# Patient Record
Sex: Female | Born: 1955 | Race: White | Hispanic: No | Smoking: Never smoker
Health system: Southern US, Community
[De-identification: ages and names within clinical notes are randomized; demographics above are authoritative.]

## PROBLEM LIST (undated history)

## (undated) DIAGNOSIS — K222 Esophageal obstruction: Secondary | ICD-10-CM

## (undated) DIAGNOSIS — Z8719 Personal history of other diseases of the digestive system: Secondary | ICD-10-CM

## (undated) DIAGNOSIS — Z87442 Personal history of urinary calculi: Secondary | ICD-10-CM

## (undated) DIAGNOSIS — K279 Peptic ulcer, site unspecified, unspecified as acute or chronic, without hemorrhage or perforation: Secondary | ICD-10-CM

## (undated) DIAGNOSIS — J189 Pneumonia, unspecified organism: Secondary | ICD-10-CM

## (undated) DIAGNOSIS — H6120 Impacted cerumen, unspecified ear: Secondary | ICD-10-CM

## (undated) DIAGNOSIS — K922 Gastrointestinal hemorrhage, unspecified: Secondary | ICD-10-CM

## (undated) DIAGNOSIS — I639 Cerebral infarction, unspecified: Secondary | ICD-10-CM

## (undated) DIAGNOSIS — Z9289 Personal history of other medical treatment: Secondary | ICD-10-CM

## (undated) DIAGNOSIS — K5090 Crohn's disease, unspecified, without complications: Secondary | ICD-10-CM

## (undated) DIAGNOSIS — Z9889 Other specified postprocedural states: Secondary | ICD-10-CM

## (undated) DIAGNOSIS — I10 Essential (primary) hypertension: Secondary | ICD-10-CM

## (undated) DIAGNOSIS — R112 Nausea with vomiting, unspecified: Secondary | ICD-10-CM

## (undated) DIAGNOSIS — E7800 Pure hypercholesterolemia, unspecified: Secondary | ICD-10-CM

## (undated) DIAGNOSIS — K219 Gastro-esophageal reflux disease without esophagitis: Secondary | ICD-10-CM

## (undated) HISTORY — PX: ESOPHAGOGASTRODUODENOSCOPY: SHX1529

## (undated) HISTORY — PX: COLONOSCOPY: SHX174

## (undated) HISTORY — DX: Impacted cerumen, unspecified ear: H61.20

---

## 1974-10-29 HISTORY — PX: APPENDECTOMY: SHX54

## 1975-10-30 HISTORY — PX: TOTAL ABDOMINAL HYSTERECTOMY: SHX209

## 1976-10-29 HISTORY — PX: SALPINGOOPHORECTOMY: SHX82

## 1996-10-29 DIAGNOSIS — J189 Pneumonia, unspecified organism: Secondary | ICD-10-CM

## 1996-10-29 HISTORY — DX: Pneumonia, unspecified organism: J18.9

## 2004-10-29 DIAGNOSIS — Z9289 Personal history of other medical treatment: Secondary | ICD-10-CM

## 2004-10-29 HISTORY — DX: Personal history of other medical treatment: Z92.89

## 2004-10-29 HISTORY — PX: OTHER SURGICAL HISTORY: SHX169

## 2007-12-04 ENCOUNTER — Other Ambulatory Visit: Admission: RE | Admit: 2007-12-04 | Discharge: 2007-12-04 | Payer: Self-pay | Admitting: *Deleted

## 2007-12-18 ENCOUNTER — Ambulatory Visit (HOSPITAL_COMMUNITY): Admission: RE | Admit: 2007-12-18 | Discharge: 2007-12-18 | Payer: Self-pay | Admitting: *Deleted

## 2008-01-02 ENCOUNTER — Encounter: Admission: RE | Admit: 2008-01-02 | Discharge: 2008-01-02 | Payer: Self-pay | Admitting: *Deleted

## 2008-01-12 ENCOUNTER — Encounter: Admission: RE | Admit: 2008-01-12 | Discharge: 2008-01-12 | Payer: Self-pay | Admitting: *Deleted

## 2008-08-19 ENCOUNTER — Emergency Department (HOSPITAL_COMMUNITY): Admission: EM | Admit: 2008-08-19 | Discharge: 2008-08-19 | Payer: Self-pay | Admitting: Emergency Medicine

## 2011-07-31 LAB — POCT I-STAT, CHEM 8
Creatinine, Ser: 0.9
Glucose, Bld: 116 — ABNORMAL HIGH
Hemoglobin: 16 — ABNORMAL HIGH
Potassium: 3.9
Sodium: 138

## 2012-01-09 ENCOUNTER — Inpatient Hospital Stay (HOSPITAL_BASED_OUTPATIENT_CLINIC_OR_DEPARTMENT_OTHER)
Admission: EM | Admit: 2012-01-09 | Discharge: 2012-01-11 | DRG: 063 | Disposition: A | Discharge status: Home / Self Care | Payer: 59 | Source: Ambulatory Visit | Attending: Neurology | Admitting: Neurology

## 2012-01-09 ENCOUNTER — Inpatient Hospital Stay (HOSPITAL_COMMUNITY): Payer: 59

## 2012-01-09 ENCOUNTER — Encounter (HOSPITAL_BASED_OUTPATIENT_CLINIC_OR_DEPARTMENT_OTHER): Payer: Self-pay | Admitting: *Deleted

## 2012-01-09 ENCOUNTER — Emergency Department (HOSPITAL_BASED_OUTPATIENT_CLINIC_OR_DEPARTMENT_OTHER): Payer: 59

## 2012-01-09 ENCOUNTER — Emergency Department (INDEPENDENT_AMBULATORY_CARE_PROVIDER_SITE_OTHER): Payer: 59

## 2012-01-09 ENCOUNTER — Other Ambulatory Visit: Payer: Self-pay

## 2012-01-09 DIAGNOSIS — Z8673 Personal history of transient ischemic attack (TIA), and cerebral infarction without residual deficits: Secondary | ICD-10-CM

## 2012-01-09 DIAGNOSIS — I6350 Cerebral infarction due to unspecified occlusion or stenosis of unspecified cerebral artery: Principal | ICD-10-CM | POA: Diagnosis present

## 2012-01-09 DIAGNOSIS — H53461 Homonymous bilateral field defects, right side: Secondary | ICD-10-CM

## 2012-01-09 DIAGNOSIS — I10 Essential (primary) hypertension: Secondary | ICD-10-CM | POA: Diagnosis present

## 2012-01-09 DIAGNOSIS — I517 Cardiomegaly: Secondary | ICD-10-CM

## 2012-01-09 DIAGNOSIS — E785 Hyperlipidemia, unspecified: Secondary | ICD-10-CM

## 2012-01-09 DIAGNOSIS — I639 Cerebral infarction, unspecified: Secondary | ICD-10-CM

## 2012-01-09 DIAGNOSIS — K219 Gastro-esophageal reflux disease without esophagitis: Secondary | ICD-10-CM | POA: Diagnosis present

## 2012-01-09 DIAGNOSIS — H53469 Homonymous bilateral field defects, unspecified side: Secondary | ICD-10-CM

## 2012-01-09 HISTORY — DX: Gastro-esophageal reflux disease without esophagitis: K21.9

## 2012-01-09 HISTORY — DX: Essential (primary) hypertension: I10

## 2012-01-09 HISTORY — DX: Crohn's disease, unspecified, without complications: K50.90

## 2012-01-09 LAB — TROPONIN I: Troponin I: <0.3 ng/mL (ref <0.30)

## 2012-01-09 LAB — COMPREHENSIVE METABOLIC PANEL
ALT: 21 U/L (ref 0–35)
Alkaline Phosphatase: 98 U/L (ref 39–117)
CO2: 25 mEq/L (ref 19–32)
Chloride: 103 mEq/L (ref 96–112)
Creatinine, Ser: 0.8 mg/dL (ref 0.50–1.10)
GFR calc Af Amer: >90 mL/min (ref >90)
Glucose, Bld: 116 mg/dL — ABNORMAL HIGH (ref 70–99)
Potassium: 3.9 mEq/L (ref 3.5–5.1)

## 2012-01-09 LAB — CBC
HCT: 43.2 % (ref 36.0–46.0)
Hemoglobin: 14.3 g/dL (ref 12.0–15.0)
MCHC: 33.1 g/dL (ref 30.0–36.0)
Platelets: 265 10*3/uL (ref 150–400)
WBC: 9.5 10*3/uL (ref 4.0–10.5)

## 2012-01-09 LAB — DIFFERENTIAL
Basophils Absolute: 0.1 10*3/uL (ref 0.0–0.1)
Basophils Relative: 1 % (ref 0–1)
Lymphocytes Relative: 27 % (ref 12–46)
Lymphs Abs: 2.5 10*3/uL (ref 0.7–4.0)
Monocytes Relative: 6 % (ref 3–12)
Neutro Abs: 6.2 10*3/uL (ref 1.7–7.7)
Neutrophils Relative %: 66 % (ref 43–77)

## 2012-01-09 LAB — MRSA PCR SCREENING: MRSA by PCR: NEGATIVE

## 2012-01-09 MED ORDER — SENNOSIDES-DOCUSATE SODIUM 8.6-50 MG PO TABS
1.0000 | ORAL_TABLET | Freq: Every evening | ORAL | Status: DC | PRN
  Filled 2012-01-09: qty 1

## 2012-01-09 MED ORDER — SODIUM CHLORIDE 0.9 % IV SOLN
250.0000 mL | Freq: Once | INTRAVENOUS | Status: AC
  Administered 2012-01-09: 07:00:00 via INTRAVENOUS

## 2012-01-09 MED ORDER — PANTOPRAZOLE SODIUM 40 MG IV SOLR
40.0000 mg | Freq: Every day | INTRAVENOUS | Status: DC
  Administered 2012-01-09 – 2012-01-10 (×2): 40 mg via INTRAVENOUS
  Filled 2012-01-09 (×3): qty 40

## 2012-01-09 MED ORDER — ONDANSETRON HCL 4 MG/2ML IJ SOLN: 4.0000 mg | Freq: Four times a day (QID) | INTRAMUSCULAR | Status: DC | PRN

## 2012-01-09 MED ORDER — SODIUM CHLORIDE 0.9 % IV SOLN
INTRAVENOUS | Status: DC
  Administered 2012-01-09: 09:00:00 via INTRAVENOUS
  Administered 2012-01-09: 75 mL/h via INTRAVENOUS

## 2012-01-09 MED ORDER — ALTEPLASE (STROKE) FULL DOSE INFUSION
0.9000 mg/kg | Freq: Once | INTRAVENOUS | Status: AC
  Administered 2012-01-09: 100 mg via INTRAVENOUS

## 2012-01-09 MED ORDER — ACETAMINOPHEN 325 MG PO TABS: 650.0000 mg | ORAL_TABLET | ORAL | Status: DC | PRN

## 2012-01-09 MED ORDER — LABETALOL HCL 5 MG/ML IV SOLN: 10.0000 mg | INTRAVENOUS | Status: DC | PRN

## 2012-01-09 MED ORDER — ALTEPLASE 100 MG IV SOLR
INTRAVENOUS | Status: AC
  Filled 2012-01-09: qty 1

## 2012-01-09 MED ORDER — STROKE: EARLY STAGES OF RECOVERY BOOK
Freq: Once | Status: AC
  Administered 2012-01-09: 13:00:00
  Filled 2012-01-09: qty 1

## 2012-01-09 MED ORDER — LABETALOL HCL 5 MG/ML IV SOLN: 10.0000 mg | Freq: Once | INTRAVENOUS | Status: DC

## 2012-01-09 MED ORDER — LORAZEPAM 2 MG/ML IJ SOLN
2.0000 mg | Freq: Four times a day (QID) | INTRAMUSCULAR | Status: DC | PRN
  Administered 2012-01-10 (×2): 1 mg via INTRAVENOUS
  Filled 2012-01-09 (×2): qty 1

## 2012-01-09 MED ORDER — ACETAMINOPHEN 650 MG RE SUPP: 650.0000 mg | RECTAL | Status: DC | PRN

## 2012-01-09 NOTE — H&P (Signed)
Admission H&P    Chief Complaint: Visual loss, sudden onset  HPI: Shari Taylor is an 56 y.o. female history of hypertension, presenting with acute onset of visual difficulty. Onset was at 0400 today. She was seen in the emergency room at [facility]. Examination there showed right lower visual field deficit. NIH stroke score was 1. Laboratory studies were unremarkable. She was given intravenous thrombolytic therapy with TPA and subsequently transferred to [facility] for further management. Vision improved with apparent resolution of visual field defect. NIH stroke score on admission was 0. CT of her head at [facility] showed no acute changes. 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 CTA on arrival at [facility] was normal, including carotids and circle of Willis. No previous history of stroke nor TIA. Patient has not been on antiplatelet therapy.                                                                                                          LSN: [id number] today  tPA Given: Yes MRankin: 0  Past Medical History  Diagnosis Date  . Hypertension   . GERD (gastroesophageal reflux disease)   . Crohn disease     Past Surgical History  Procedure Date  . Appendectomy   . Abdominal hysterectomy   . Bowel resection     No family history on file. Social History:  reports that she has never smoked. She does not have any smokeless tobacco history on file. She reports that she drinks alcohol. She reports that she does not use illicit drugs.  Allergies:  Allergies  Allergen Reactions  . Penicillins Anaphylaxis  . Clindamycin/Lincomycin     Intestinal bleeding    Medications Prior to Admission  Medication Dose Route Frequency Provider Last Rate Last Dose  . 0.9 %  sodium chloride infusion  250 mL Intravenous Once Vida Roller, MD 250 mL/hr at 01/09/12 0645    . 0.9 %  sodium  chloride infusion   Intravenous Continuous Noel Christmas      . acetaminophen (TYLENOL) tablet 650 mg  650 mg Oral Q4H PRN Noel Christmas       Or  . acetaminophen (TYLENOL) suppository 650 mg  650 mg Rectal Q4H PRN Noel Christmas      . alteplase (ACTIVASE) 1 mg/mL infusion 0.9 mg/kg  0.9 mg/kg Intravenous Once Vida Roller, MD   100 mg at 01/09/12 0651  . alteplase (ACTIVASE) 2 MG injection           . labetalol (NORMODYNE,TRANDATE) injection 10 mg  10 mg Intravenous Once Vida Roller, MD      . labetalol (NORMODYNE,TRANDATE) injection 10 mg  10 mg Intravenous Q10 min PRN Noel Christmas      . ondansetron (ZOFRAN) injection 4 mg  4 mg Intravenous Q6H PRN Noel Christmas      . pantoprazole (PROTONIX) injection 40 mg  40 mg Intravenous QHS Noel Christmas      .  senna-docusate (Senokot-S) tablet 1 tablet  1 tablet Oral QHS PRN Noel Christmas       No current outpatient prescriptions on file as of 01/09/2012.    ROS: History obtained from the patient.  General ROS: negative for - chills, fatigue, fever, night sweats, weight gain or weight loss Psychological ROS: negative for - behavioral disorder, hallucinations, memory difficulties, mood swings or suicidal ideation Ophthalmic ROS: Intermittent blurring of vision and flashing lights on the left side over the past week ENT ROS: negative for - epistaxis, nasal discharge, oral lesions, sore throat, tinnitus or vertigo Allergy and Immunology ROS: negative for - hives or itchy/watery eyes Hematological and Lymphatic ROS: negative for - bleeding problems, bruising or swollen lymph nodes Endocrine ROS: negative for - galactorrhea, hair pattern changes, polydipsia/polyuria or temperature intolerance Respiratory ROS: negative for - cough, hemoptysis, shortness of breath or wheezing Cardiovascular ROS: negative for - chest pain, dyspnea on exertion, edema or irregular heartbeat Gastrointestinal ROS: negative for - abdominal pain, diarrhea,  hematemesis, nausea/vomiting or stool incontinence Genito-Urinary ROS: negative for - dysuria, hematuria, incontinence or urinary frequency/urgency Musculoskeletal ROS: negative for - joint swelling or muscular weakness Neurological ROS: as noted in HPI Dermatological ROS: negative for rash and skin lesion changes   Physical Examination: Blood pressure 176/93, pulse 69, temperature 98.4 F (36.9 C), temperature source Oral, resp. rate 12, SpO2 100.00%.  HEENT-  Normocephalic, no lesions, without obvious abnormality.  Normal external eye and conjunctiva.  Normal TM's bilaterally.  Normal auditory canals and external ears. Normal external nose, mucus membranes and septum.  Normal pharynx. Neck supple with no masses, nodes, nodules or enlargement. Cardiovascular - regular rate and rhythm, S1, S2 normal, no murmur, click, rub or gallop Lungs - chest clear, no wheezing, rales, normal symmetric air entry, Heart exam - S1, S2 normal, no murmur, no gallop, rate regular Abdomen - soft, non-tender; bowel sounds normal; no masses,  no organomegaly Extremities - no joint deformities, effusion, or inflammation, no edema and no skin discoloration  Neurologic Examination: Mental Status: Alert, oriented, thought content appropriate.  Patient was fairly anxious. Speech fluent without evidence of aphasia. Able to follow commands without difficulty. Cranial Nerves: II-Visual fields were normal with no indication of residual right visual field defect. III/IV/VI-Extraocular movements were full and conjugate.  Pupils reacted normally to light. V/VII-no facial numbness and no facial weakness VIII-grossly intact X-normal speech XII-midline tongue extension Motor: 5/5 bilaterally with normal tone and bulk Sensory: light touch intact throughout, bilaterally Deep Tendon Reflexes: 2+ and symmetric throughout Plantars: Downgoing bilaterally Cerebellar: Normal finger-to-nose and heel-to-shin test.  Normal gait and  station.   Carotid auscultation: Normal  Results for orders placed during the hospital encounter of 01/09/12 (from the past 48 hour(s))  PROTIME-INR     Status: Normal   Collection Time   01/09/12  5:50 AM      Component Value Range Comment   Prothrombin Time 13.4  11.6 - 15.2 (seconds)    INR 1.00  0.00 - 1.49    APTT     Status: Normal   Collection Time   01/09/12  5:50 AM      Component Value Range Comment   aPTT 31  24 - 37 (seconds)   CBC     Status: Normal   Collection Time   01/09/12  5:50 AM      Component Value Range Comment   WBC 9.5  4.0 - 10.5 (K/uL)    RBC 4.91  3.87 - 5.11 (  MIL/uL)    Hemoglobin 14.3  12.0 - 15.0 (g/dL)    HCT 45.4  09.8 - 11.9 (%)    MCV 88.0  78.0 - 100.0 (fL)    MCH 29.1  26.0 - 34.0 (pg)    MCHC 33.1  30.0 - 36.0 (g/dL)    RDW 14.7  82.9 - 56.2 (%)    Platelets 265  150 - 400 (K/uL)   DIFFERENTIAL     Status: Normal   Collection Time   01/09/12  5:50 AM      Component Value Range Comment   Neutrophils Relative 66  43 - 77 (%)    Neutro Abs 6.2  1.7 - 7.7 (K/uL)    Lymphocytes Relative 27  12 - 46 (%)    Lymphs Abs 2.5  0.7 - 4.0 (K/uL)    Monocytes Relative 6  3 - 12 (%)    Monocytes Absolute 0.6  0.1 - 1.0 (K/uL)    Eosinophils Relative 1  0 - 5 (%)    Eosinophils Absolute 0.1  0.0 - 0.7 (K/uL)    Basophils Relative 1  0 - 1 (%)    Basophils Absolute 0.1  0.0 - 0.1 (K/uL)   COMPREHENSIVE METABOLIC PANEL     Status: Abnormal   Collection Time   01/09/12  5:50 AM      Component Value Range Comment   Sodium 139  135 - 145 (mEq/L)    Potassium 3.9  3.5 - 5.1 (mEq/L)    Chloride 103  96 - 112 (mEq/L)    CO2 25  19 - 32 (mEq/L)    Glucose, Bld 116 (*) 70 - 99 (mg/dL)    BUN 20  6 - 23 (mg/dL)    Creatinine, Ser 1.30  0.50 - 1.10 (mg/dL)    Calcium 9.9  8.4 - 10.5 (mg/dL)    Total Protein 7.4  6.0 - 8.3 (g/dL)    Albumin 4.3  3.5 - 5.2 (g/dL)    AST 16  0 - 37 (U/L)    ALT 21  0 - 35 (U/L)    Alkaline Phosphatase 98  39 - 117 (U/L)     Total Bilirubin 0.5  0.3 - 1.2 (mg/dL)    GFR calc non Af Amer 81 (*) >90 (mL/min)    GFR calc Af Amer >90  >90 (mL/min)   CK TOTAL AND CKMB     Status: Normal   Collection Time   01/09/12  5:50 AM      Component Value Range Comment   Total CK 69  7 - 177 (U/L)    CK, MB 2.5  0.3 - 4.0 (ng/mL)    Relative Index RELATIVE INDEX IS INVALID  0.0 - 2.5    TROPONIN I     Status: Normal   Collection Time   01/09/12  5:50 AM      Component Value Range Comment   Troponin I <0.30  <0.30 (ng/mL)    Ct Head Wo Contrast  01/09/2012  *RADIOLOGY REPORT*  Clinical Data: Visual changes  CT HEAD WITHOUT CONTRAST  Technique:  Contiguous axial images were obtained from the base of the skull through the vertex without contrast.  Comparison: None.  Findings: Periventricular and subcortical white matter hypodensities are most in keeping with chronic microangiopathic change.  More focal hypoattenuation within the right  anterior limb internal capsule.  There is no evidence for acute hemorrhage, overt hydrocephalus, mass lesion, or abnormal extra-axial fluid collection.  No  definite CT evidence for acute cortical based (large artery) infarction.  The visualized paranasal sinuses and mastoid air cells are predominately clear.  The globes are symmetric.  IMPRESSION: Hypoattenuation within the right anterior limb internal capsule may represent an age indeterminate lacunar infarction.  There is no cortical based (large artery) infarction.  Original Report Authenticated By: Waneta Martins, M.D.    Assessment: 56 y.o. female transient right visual field defect, resolved following thrombolytic therapy with IV TPA. Acute left PCA territory stroke with need to be ruled out. Overall stroke risk will also need to be addressed.  Stroke Risk Factors - hypertension  Plan: 1. HgbA1c, fasting lipid panel 2. MRI of the brain without contrast 3. PT consult, OT consult, Speech consult 4. Echocardiogram 5. Prophylactic  therapy-Antiplatelet med: Aspirin 325 mg per day if CT scan of the head 24 hours post t-PA shows no hemorrhage 6. Risk factor modification 7. Telemetry monitoring  C.R. Roseanne Reno, MD [facility] [phone]  01/09/2012, 8:40 AM

## 2012-01-09 NOTE — Progress Notes (Addendum)
01/09/2012 08:20   Pt arrived to ICU via [facility]. tpa administration & CTA head/neck complete. Dr. Roseanne Reno at bedside.   Holly Bodily

## 2012-01-09 NOTE — Evaluation (Signed)
Speech Language Pathology Evaluation Patient Details Name: MAKARI SANKO MRN: [id number] DOB: 1956-10-18 Today's Date: 01/09/2012  HPI:  56 yr old admitted with sudden visual disturbance.  CT revealed hypoattenuation in right internal capsule with possible age inderterminate infarct.    Problem List: There is no problem list on file for this patient.  Past Medical History:  Past Medical History  Diagnosis Date  . Hypertension   . GERD (gastroesophageal reflux disease)   . Crohn disease    Past Surgical History:  Past Surgical History  Procedure Date  . Appendectomy   . Abdominal hysterectomy   . Bowel resection     SLP Assessment/Plan/Recommendation Assessment Clinical Impression Statement: Pt. performed within fucntional limits for all cognitive items assessed.  Speech is fluent and intelligible.  She did not exhibit difficulty expressing thoughts or relaying events from today and past several weeks.  ST is not warrented at this time.  Discussed with pt. that ST is available if pt. experiences difficutly once bact to normal routine. 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[employer])  Cognition Overall Cognitive Status: Appears within functional limits for tasks assessed Orientation Level: Oriented X4  Comprehension Auditory Comprehension Overall Auditory Comprehension: Appears within functional limits for tasks assessed Interfering Components:   (SLIGHTLY ANXIOUS) Visual Recognition/Discrimination Discrimination: Not tested Reading Comprehension Reading Status: Within funtional limits  Expression Expression Primary Mode of Expression: Verbal Verbal Expression Overall Verbal Expression: Appears within functional limits for tasks assessed Pragmatics: No impairment Non-Verbal Means of Communication: Not applicable Written Expression Written Expression: Not tested  Oral/Motor Oral Motor/Sensory Function Overall Oral Motor/Sensory Function: Appears within functional limits for tasks assessed Motor Speech Overall Motor Speech: Appears within functional limits for tasks assessed Intelligibility: Intelligible Motor Planning: Witnin functional limits  Breck Coons [employer] [phone]  01/09/2012

## 2012-01-09 NOTE — ED Notes (Signed)
Pt says that she has had a hx of floater in left eye, within the hour has had flashers and having blind spots.

## 2012-01-09 NOTE — ED Notes (Signed)
MD at bedside to discuss plan of care

## 2012-01-09 NOTE — ED Provider Notes (Signed)
History     CSN: [id number]  Arrival date & time 01/09/12  4696   First MD Initiated Contact with Patient 01/09/12 [phone]      Chief Complaint  Patient presents with  . Code Stroke  . Visual Field Change    (Consider location/radiation/quality/duration/timing/severity/associated sxs/prior treatment) HPI Comments: 47 rolled female with history of hypertension for which she takes hypertensives presents with acute onset of floaters, flashing is loss of vision. This started approximately 4:00 AM, is persistent, nothing makes this better or worse, this is not associated with weakness, numbness, double vision, ataxia, a fascia. She denies any chest pain, palpitations, neck pain. She states that over the last couple of weeks she has had a floater which she sees in her left eye but today this for became more prominent, and was associated with loss of vision. She notices a loss of vision predominantly in her left eye.  The history is provided by the patient.    Past Medical History  Diagnosis Date  . Hypertension   . GERD (gastroesophageal reflux disease)   . Crohn disease     Past Surgical History  Procedure Date  . Appendectomy   . Abdominal hysterectomy   . Bowel resection     No family history on file.  History  Substance Use Topics  . Smoking status: Never Smoker   . Smokeless tobacco: Not on file  . Alcohol Use: Yes    OB History    Grav Para Term Preterm Abortions TAB SAB Ect Mult Living                  Review of Systems  All other systems reviewed and are negative.    Allergies  Penicillins and Clindamycin/lincomycin  Home Medications   Current Outpatient Rx  Name Route Sig Dispense Refill  . AMLODIPINE BESYLATE 10 MG PO TABS Oral Take 10 mg by mouth daily.    Marland Kitchen HYDROCHLOROTHIAZIDE 25 MG PO TABS Oral Take 25 mg by mouth daily.    Marland Kitchen RANITIDINE HCL 150 MG PO CAPS Oral Take 150 mg by mouth 2 (two) times daily.      BP 190/101  Pulse 77  Temp(Src) 97.7  F (36.5 C) (Oral)  Resp 15  SpO2 100%  Physical Exam  Nursing note and vitals reviewed. Constitutional: She appears well-developed and well-nourished. No distress.  HENT:  Head: Normocephalic and atraumatic.  Mouth/Throat: Oropharynx is clear and moist. No oropharyngeal exudate.  Eyes: Conjunctivae and EOM are normal. Pupils are equal, round, and reactive to light. Right eye exhibits no discharge. Left eye exhibits no discharge. No scleral icterus.       No afferent pupillary defect, normal extraocular movements, no ptosis or lid lag, pupils 5 mm and reactive bilaterally.  Optic discs are crisp bilaterally, intraocular vessels with no obvious abnormalities  Neck: Normal range of motion. Neck supple. No JVD present. No thyromegaly present.       No carotid bruit  Cardiovascular: Normal rate, regular rhythm, normal heart sounds and intact distal pulses.  Exam reveals no gallop and no friction rub.   No murmur heard. Pulmonary/Chest: Effort normal and breath sounds normal. No respiratory distress. She has no wheezes. She has no rales.  Abdominal: Soft. Bowel sounds are normal. She exhibits no distension and no mass. There is no tenderness.  Musculoskeletal: Normal range of motion. She exhibits no edema and no tenderness.  Lymphadenopathy:    She has no cervical adenopathy.  Neurological: She is alert.  Coordination normal.       Neurologic exam:  Speech clear, pupils equal round reactive to light, extraocular movements intact  Normal peripheral visual fields Cranial nerves III through XII normal including no facial droop Follows commands, moves all extremities x4, normal strength to bilateral upper and lower extremities at all major muscle groups including grip Sensation normal to light touch and pinprick Coordination intact, no limb ataxia, finger-nose-finger normal Rapid alternating movements normal No pronator drift Gait normal  Vision at baseline except for homonymous hemianopsia  with loss of vision in the lower right visual field in bilateral eyes   Skin: Skin is warm and dry. No rash noted. No erythema.  Psychiatric: She has a normal mood and affect. Her behavior is normal.    ED Course  Procedures (including critical care time)  ED ECG REPORT   Date: 01/09/2012   Rate: 70  Rhythm: normal sinus rhythm  QRS Axis: normal  Intervals: normal  ST/T Wave abnormalities: normal  Conduction Disutrbances:none  Narrative Interpretation: Normal ECG  Old EKG Reviewed: Compared with EKG from 08/20/1999, no acute changes       Labs Reviewed  PROTIME-INR  APTT  CBC  DIFFERENTIAL  CK TOTAL AND CKMB  TROPONIN I  COMPREHENSIVE METABOLIC PANEL   Ct Head Wo Contrast  01/09/2012  *RADIOLOGY REPORT*  Clinical Data: Visual changes  CT HEAD WITHOUT CONTRAST  Technique:  Contiguous axial images were obtained from the base of the skull through the vertex without contrast.  Comparison: None.  Findings: Periventricular and subcortical white matter hypodensities are most in keeping with chronic microangiopathic change.  More focal hypoattenuation within the right  anterior limb internal capsule.  There is no evidence for acute hemorrhage, overt hydrocephalus, mass lesion, or abnormal extra-axial fluid collection.  No definite CT evidence for acute cortical based (large artery) infarction.  The visualized paranasal sinuses and mastoid air cells are predominately clear.  The globes are symmetric.  IMPRESSION: Hypoattenuation within the right anterior limb internal capsule may represent an age indeterminate lacunar infarction.  There is no cortical based (large artery) infarction.  Original Report Authenticated By: Waneta Martins, M.D.     1. Stroke   2. Hemianopia, homonymous, right       MDM  The patient appears to have a new homonymous hemianopsia. With her hypertension and blood pressure of 200 systolic would be concerned for vascular cause. Neurology paged immediately  consultation. CT scan ordered, lumbar, EKG. There are no other findings on neurologic exam other than visual loss.  Code stroke activated at 5:48 AM   Lab work shows normal CBC, no anemia, normal platelets, normal coagulation panel. CT scan of the head shows hypoattenuation within the right anterior limb of the internal capsule. There is no other large area of infarction.  Neurology returned call approximately [phone], accepted patient in transfer to intensive care unit, recommended thrombolytic therapy given visual loss. Blood pressure has improved to 175/100, this is amenable to lytic therapy. There are no other contraindications at this time to thrombolytics. I have discussed at length with the patient the risks including the risk of bleeding, benefits and alternatives and she has agreed to proceed with thrombolytics.  Will transfer to [facility]  CRITICAL CARE Performed by: Vida Roller   Total critical care time: 35  Critical care time was exclusive of separately billable procedures and treating other patients.  Critical care was necessary to treat or prevent imminent or life-threatening deterioration.  Critical care was  time spent personally by me on the following activities: development of treatment plan with patient and/or surrogate as well as nursing, discussions with consultants, evaluation of patient's response to treatment, examination of patient, obtaining history from patient or surrogate, ordering and performing treatments and interventions, ordering and review of laboratory studies, ordering and review of radiographic studies, pulse oximetry and re-evaluation of patient's condition.       Vida Roller, MD 01/09/12 [phone]

## 2012-01-09 NOTE — Progress Notes (Signed)
  Echocardiogram 2D Echocardiogram has been performed.  Jorje Guild [facility] 01/09/2012, 10:47 AM

## 2012-01-09 NOTE — Progress Notes (Signed)
Chaplain Note:  Chaplain introduced Shari Taylor to pt and was invited into the room.  Pt was awake and resting in bed.  Chaplain noted that pt had been crying.  Chaplain offered spiritual comfort and support.  Pt was appreciative of chaplain support but stated that she did not wish to talk at this time. Chaplain will follow up if requested.  01/09/12 1600  Clinical Encounter Type  Visited With Patient  Visit Type Spiritual support  Spiritual Encounters  Spiritual Needs Emotional  Stress Factors  Patient Stress Factors Health changes  Family Stress Factors None identified (pt requested to be classified xxx)   Verdie Shire, [phone]

## 2012-01-09 NOTE — ED Notes (Signed)
Dr/ Hyacinth Meeker in with patient prior to admin of TPA to discuss risk/benefits of administration. Pt monitored 1:1 after start of TPA, currently awaiting transport by [facility] to [facility] for admission.

## 2012-01-09 NOTE — ED Notes (Signed)
Pt return from CT, NAD noted. EKG obtained. Pt alert and oriented, no slurred speech, no facial droop.

## 2012-01-09 NOTE — ED Notes (Signed)
Gave report to [place], Charity fundraiser. Confirmed that pt will still need stroke swallow screen, unable to perform d/t acuity and awaiting on transfer. Also ntoed that pt is only to receive the 54 mg dose left in pump

## 2012-01-09 NOTE — ED Notes (Signed)
TPA verified by second RN, Jodell Cipro, RN

## 2012-01-09 NOTE — ED Notes (Signed)
Code Stroke initiated by Dr. Hyacinth Meeker via Mel[place] Hake

## 2012-01-10 ENCOUNTER — Encounter (HOSPITAL_COMMUNITY): Payer: Self-pay | Admitting: Radiology

## 2012-01-10 ENCOUNTER — Inpatient Hospital Stay (HOSPITAL_COMMUNITY): Payer: 59

## 2012-01-10 LAB — HEMOGLOBIN A1C
Hgb A1c MFr Bld: 6.2 % — ABNORMAL HIGH (ref <5.7)
Mean Plasma Glucose: 131 mg/dL — ABNORMAL HIGH (ref <117)

## 2012-01-10 LAB — LIPID PANEL
HDL: 34 mg/dL — ABNORMAL LOW (ref >39)
LDL Cholesterol: 122 mg/dL — ABNORMAL HIGH (ref 0–99)

## 2012-01-10 MED ORDER — IOHEXOL 350 MG/ML SOLN
50.0000 mL | Freq: Once | INTRAVENOUS | Status: AC | PRN
  Administered 2012-01-10: 50 mL via INTRAVENOUS

## 2012-01-10 MED ORDER — MIDAZOLAM HCL 10 MG/2ML IJ SOLN: 10.0000 mg | Freq: Once | INTRAMUSCULAR | Status: DC

## 2012-01-10 MED ORDER — CLOPIDOGREL BISULFATE 75 MG PO TABS
75.0000 mg | ORAL_TABLET | Freq: Every day | ORAL | Status: DC
  Administered 2012-01-10 – 2012-01-11 (×2): 75 mg via ORAL
  Filled 2012-01-10 (×3): qty 1

## 2012-01-10 MED ORDER — FENTANYL CITRATE 0.05 MG/ML IJ SOLN: 250.0000 ug | Freq: Once | INTRAMUSCULAR | Status: DC

## 2012-01-10 MED ORDER — FAMOTIDINE 20 MG PO TABS
20.0000 mg | ORAL_TABLET | Freq: Every day | ORAL | Status: DC
  Administered 2012-01-10 – 2012-01-11 (×2): 20 mg via ORAL
  Filled 2012-01-10 (×2): qty 1

## 2012-01-10 MED ORDER — AMLODIPINE BESYLATE 10 MG PO TABS
10.0000 mg | ORAL_TABLET | Freq: Every day | ORAL | Status: DC
  Administered 2012-01-10 – 2012-01-11 (×2): 10 mg via ORAL
  Filled 2012-01-10 (×2): qty 1

## 2012-01-10 MED ORDER — HYDROCHLOROTHIAZIDE 25 MG PO TABS
25.0000 mg | ORAL_TABLET | Freq: Every day | ORAL | Status: DC
  Administered 2012-01-10 – 2012-01-11 (×2): 25 mg via ORAL
  Filled 2012-01-10 (×2): qty 1

## 2012-01-10 MED ORDER — BENZOCAINE 20 % MT SOLN
1.0000 "application " | OROMUCOSAL | Status: DC | PRN
  Filled 2012-01-10: qty 57

## 2012-01-10 MED ORDER — SODIUM CHLORIDE 0.45 % IV SOLN
INTRAVENOUS | Status: DC
  Administered 2012-01-11: 08:00:00 via INTRAVENOUS

## 2012-01-10 MED ORDER — SIMVASTATIN 20 MG PO TABS
20.0000 mg | ORAL_TABLET | Freq: Every day | ORAL | Status: DC
  Administered 2012-01-10: 20 mg via ORAL
  Filled 2012-01-10 (×2): qty 1

## 2012-01-10 MED FILL — Labetalol HCl IV Soln 5 MG/ML: INTRAVENOUS | Qty: 4 | Status: AC

## 2012-01-10 MED FILL — Morphine Sulfate Inj 10 MG/ML: INTRAMUSCULAR | Qty: 1 | Status: AC

## 2012-01-10 NOTE — Progress Notes (Signed)
Stroke Team Progress Note  HISTORY Shari Taylor is an 56 y.o. female history of hypertension, presenting with acute onset of visual difficulty that started 01/09/2012 at 0400. She was seen in the emergency room at [facility]. Examination there showed right lower visual field deficit. NIH stroke score was 1. Laboratory studies were unremarkable. She was given intravenous thrombolytic therapy with TPA and subsequently transferred to [facility] for further management. Vision improved with apparent resolution of visual field defect. NIH stroke score on admission was 0. CT of her head at [facility] showed no acute changes. 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 CTA on arrival at [facility] was normal, including carotids and circle of Willis. No previous history of stroke nor TIA. Patient has not been on antiplatelet therapy.   SUBJECTIVE Did well overnight. No complaints. BP easily controlled. No headache or migraines.F/H/o strokes and MIs but not sure of the ages of onset. Denies smoking, birth control pills, positive h/o HT and Lipids.  OBJECTIVE Filed Vitals:   01/10/12 0300 01/10/12 0400 01/10/12 0500 01/10/12 0600  BP: 150/90 150/98 150/85 150/84  Pulse: 61 69 58 61  Temp:  97.2 F (36.2 C)    TempSrc:  Oral    Resp: 15 18 11 13   Height:      Weight:      SpO2: 95% 95% 96% 98%    CBG (last 3) No results found for this basename: GLUCAP:3 in the last 72 hours Intake/Output from previous day: 03/13 0701 - 03/14 0700 In: 1207.5 [P.O.:420; I.V.:787.5] Out: 1770 [Urine:1770]  IV Fluid Intake:     . sodium chloride 75 mL/hr at 01/10/12 0600   Medications    .  stroke: mapping our early stages of recovery book   Does not apply Once  . alteplase  0.9 mg/kg Intravenous Once  . alteplase      . labetalol  10 mg Intravenous Once  . pantoprazole (PROTONIX) IV  40 mg Intravenous  QHS  PRN acetaminophen, acetaminophen, labetalol, LORazepam, ondansetron (ZOFRAN) IV, senna-docusate  Diet:  Cardiac thin liquids Activity:  Bedrest  DVT Prophylaxis:  SCDs   Significant Diagnostic Studies: CBC    Component Value Date/Time   WBC 9.5 01/09/2012 0550   RBC 4.91 01/09/2012 0550   HGB 14.3 01/09/2012 0550   HCT 43.2 01/09/2012 0550   PLT 265 01/09/2012 0550   MCV 88.0 01/09/2012 0550   MCH 29.1 01/09/2012 0550   MCHC 33.1 01/09/2012 0550   RDW 13.3 01/09/2012 0550   LYMPHSABS 2.5 01/09/2012 0550   MONOABS 0.6 01/09/2012 0550   EOSABS 0.1 01/09/2012 0550   BASOSABS 0.1 01/09/2012 0550   CMP    Component Value Date/Time   NA 139 01/09/2012 0550   K 3.9 01/09/2012 0550   CL 103 01/09/2012 0550   CO2 25 01/09/2012 0550   GLUCOSE 116* 01/09/2012 0550   BUN 20 01/09/2012 0550   CREATININE 0.80 01/09/2012 0550   CALCIUM 9.9 01/09/2012 0550   PROT 7.4 01/09/2012 0550   ALBUMIN 4.3 01/09/2012 0550   AST 16 01/09/2012 0550   ALT 21 01/09/2012 0550   ALKPHOS 98 01/09/2012 0550   BILITOT 0.5 01/09/2012 0550   GFRNONAA 81* 01/09/2012 0550   GFRAA >90 01/09/2012 0550   COAGS Lab Results  Component Value Date   INR 1.00 01/09/2012   Lipid Panel    Component Value Date/Time  CHOL 189 01/10/2012 0430   TRIG 164* 01/10/2012 0430   HDL 34* 01/10/2012 0430   CHOLHDL 5.6 01/10/2012 0430   VLDL 33 01/10/2012 0430   LDLCALC 122* 01/10/2012 0430   HgbA1C  No results found for this basename: HGBA1C   Urine Drug Screen  No results found for this basename: labopia, cocainscrnur, labbenz, amphetmu, thcu, labbarb    Alcohol Level No results found for this basename: eth   MRSA neg   CT of the brain  01/09/2012 Hypoattenuation within the right anterior limb internal capsule may represent an age indeterminate lacunar infarction.  There is no cortical based (large artery) infarction.   CT angio head 01/09/2012  1.  Focal areas of hypoattenuation within the right basal ganglia are compatible with age  indeterminate lacunar infarcts. 2.  Normal variant circle of Willis without evidence for significant proximal stenosis, aneurysm, or branch vessel occlusion.   CT angio neck  01/09/2012 1.  No significant atherosclerotic or stenotic disease in the neck. 2.  Minimal irregularity of the right carotid bifurcation without significant stenosis. 3.  Borderline adenopathy, left worse than right.  No primary lesion is evident.  This may be reactive.    MRI of the brain  ordered  MRA of the brain  See CT angio  2D Echocardiogram  EF 60-65% with no source of embolus.   Carotid Doppler  See angio  CXR  01/09/2012 1.  No acute cardiopulmonary abnormalities.   EKG  normal EKG, normal sinus rhythm.   Physical Exam   Pleasant middle aged Caucasian lady not in distress. Afebrile. Head is nontraumatic. Neck is supple without bruit. Hearing is normal. Cardiac exam no murmur or gallop. Lungs clear to auscultation. Abdomen soft nontender.  Neurological exam Awake  Alert oriented x 3. Normal speech and language.eye movements full without nystagmus. Face symmetric. Tongue midline. No visual field defect on bedside confrontational exam Normal strength, tone, reflexes and coordination. Normal sensation. Gait deferred.  NIH stroke scale is 0 ASSESSMENT [name] is a 56 y.o. female with a possible left PCA infarct with transient right visual deficit, secondary to unknown etiology, status post IV t-PA 01/09/2012 at 0651. -CT head shows a possible silent right basal ganglia infarcts of remote age -hypertension -GERD -Hyperlipidemia  Hospital day # 1  TREATMENT/PLAN -post tpa imaging (MRI ordered). Strict control of blood pressure - Start Plavix if imaging negative for hemorrhage as patient had ulcers on aspirin. Add a statin for elevated lipids. -transfer to the floor -OOB, therapy evals -Check transesophageal echocardiogram, hemoglobin A1c, hypercoagulable panel labs, vasculitis labs.This patient  is critically ill and at significant risk of neurological worsening, death and care requires constant monitoring of vital signs, hemodynamics,respiratory and cardiac monitoring, neurological assessment, discussion with family, other specialists and medical decision making of high complexity. I spent 30 minutes of neurocritical care time  in the care of  this patient.

## 2012-01-10 NOTE — Evaluation (Signed)
Physical Therapy Evaluation Patient Details Name: Shari Taylor MRN: [id number] DOB: 1956/05/10 Today's Date: 01/10/2012  Problem List: There is no problem list on file for this patient.   Past Medical History:  Past Medical History  Diagnosis Date  . Hypertension   . GERD (gastroesophageal reflux disease)   . Crohn disease    Past Surgical History:  Past Surgical History  Procedure Date  . Appendectomy   . Abdominal hysterectomy   . Bowel resection     PT Assessment/Plan/Recommendation Clinical Impression Statement: 56 yo female presently with Rt lower quadrant visual deficits and CT scan showing old Rt lucunar infarct. NIH scale on arrival =1. Pt received tPA . Pt currently with no follow-up PT needs. PT Recommendation/Assessment: Patent does not need any further PT services No Skilled PT: Patient at baseline level of functioning;Patient is independent with all acitivity/mobility Follow Up Recommendations: No PT follow up Equipment Recommended: None recommended by PT  PT Evaluation Precautions/Restrictions  Precautions Precaution Comments: none Prior Functioning  Home Living Lives With: Significant other Type of Home: House Home Layout: Two level;Able to live on main level with bedroom/bathroom Home Access: Stairs to enter Entrance Stairs-Rails: None Entrance Stairs-Number of Steps: 1-2 Bathroom Shower/Tub: Door;Walk-in Stage manager: Standard Home Adaptive Equipment: None Prior Function Level of Independence: Independent with basic ADLs;Independent with homemaking with ambulation;Independent with gait Driving: Yes Vocation: Full time 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[employer]; sitting; standing; Freight forwarder Arousal/Alertness: Awake/alert Overall Cognitive Status: Appears within functional limits for tasks assessed Orientation Level: Oriented X4 Sensation/Coordination Sensation Light Touch: Appears Intact (bil  legs) Coordination Gross Motor Movements are Fluid and Coordinated: Yes  Extremity Assessment RUE Assessment RUE Assessment: Within Functional Limits LUE Assessment LUE Assessment: Within Functional Limits RLE Assessment RLE Assessment: Within Functional Limits LLE Assessment LLE Assessment: Within Functional Limits Mobility (including Balance) Bed Mobility Bed Mobility: No Transfers Sit to Stand: With upper extremity assist;From chair/3-in-1;From toilet;With armrests;6: Modified independent (Device/Increase time);Other (comment) (grab bar beside toilet) Stand to Sit: 6: Modified independent (Device/Increase time);With upper extremity assist;With armrests;To chair/3-in-1;To toilet;Other (comment) (grab bar at toilet) Ambulation/Gait Ambulation/Gait: Yes Ambulation/Gait Assistance: 7: Independent Ambulation Distance (Feet): 220 Feet Assistive device: None Gait Pattern: Within Functional Limits Gait velocity: good ability to vary speed both faster and slower Stairs: No (pt in ICU)  Posture/Postural Control Posture/Postural Control: No significant limitations Balance Balance Assessed: Yes Dynamic Sitting Balance Dynamic Sitting - Balance Support: No upper extremity supported;Feet supported;During functional activity Dynamic Sitting - Level of Assistance: 7: Independent Static Standing Balance Static Standing - Level of Assistance: 7: Independent Rhomberg - Eyes Opened: 30  Rhomberg - Eyes Closed: 30  Dynamic Gait Index Level Surface: Normal Change in Gait Speed: Normal Gait with Horizontal Head Turns: Normal Gait with Vertical Head Turns: Normal Gait and Pivot Turn: Normal Step Over Obstacle: Normal Exercise    End of Session PT - End of Session Equipment Utilized During Treatment: Gait belt Activity Tolerance: Patient tolerated treatment well Patient left: in chair;with call bell in reach;Other (comment) (OT present) General Behavior During Session: [facility] for tasks  performed Cognition: [facility] for tasks performed  Yosmar Ryker 01/10/2012, 11:47 AM  Pager [phone]

## 2012-01-10 NOTE — Evaluation (Signed)
Occupational Therapy Evaluation Patient Details Name: Shari Taylor MRN: [id number] DOB: December 25, 1955 Today's Date: 01/10/2012  Problem List: There is no problem list on file for this patient.   Past Medical History:  Past Medical History  Diagnosis Date  . Hypertension   . GERD (gastroesophageal reflux disease)   . Crohn disease    Past Surgical History:  Past Surgical History  Procedure Date  . Appendectomy   . Abdominal hysterectomy   . Bowel resection     OT Assessment/Plan/Recommendation OT Assessment Clinical Impression Statement: 56 yo female presently with Rt lower quadrant visual deficits and CT scan showing old Rt lucunar infarct. NIH scale on arrival =1. Pt received tPa . Pt currently with no acute OT needs. Recommend follow up with ophthalmologist for visual deficits. Pt currently compensating well. OT Recommendation/Assessment: Patient does not need any further OT services OT Recommendation Follow Up Recommendations: No OT follow up Equipment Recommended: None recommended by OT  OT Evaluation   Prior Functioning Home Living Lives With: Significant other Type of Home: House Home Layout: Two level;Able to live on main level with bedroom/bathroom Home Access: Stairs to enter Entrance Stairs-Rails: None Entrance Stairs-Number of Steps: 1-2 Bathroom Shower/Tub: Door;Walk-in Stage manager: Standard Home Adaptive Equipment: None Prior Function Level of Independence: Independent with basic ADLs;Independent with homemaking with ambulation;Independent with gait Driving: Yes Vocation: Full time 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[employer]; sitting; standing; sorting ADL ADL Eating/Feeding: Simulated;Modified independent Where Assessed - Eating/Feeding: Chair Grooming: Performed;Wash/dry hands;Teeth care;Independent Where Assessed - Grooming: Standing at sink Upper Body Bathing: Simulated;Chest;Right arm;Left arm;Abdomen;Independent Where  Assessed - Upper Body Bathing: Sit to stand from chair Lower Body Bathing: Performed;Independent Where Assessed - Lower Body Bathing: Unsupported;Sitting, chair Upper Body Dressing: Simulated;Independent Where Assessed - Upper Body Dressing: Sit to stand from chair Lower Body Dressing: Performed;Independent Where Assessed - Lower Body Dressing: Sitting, chair;Unsupported Toilet Transfer: Performed;Independent Toilet Transfer Method: Proofreader: Regular height toilet Toileting - Clothing Manipulation: Performed;Independent Where Assessed - Toileting Clothing Manipulation: Sit to stand from 3-in-1 or toilet Toileting - Hygiene: Performed;Independent Where Assessed - Toileting Hygiene: Sit to stand from 3-in-1 or toilet Tub/Shower Transfer: Simulated;Modified independent Tub/Shower Transfer Method: Science writer: Walk in shower Ambulation Related to ADLs: pt ambulating and reports "it feels normal" ADL Comments: Pt reports visual changes in lower Rt quadrant. Pt completed two handouts (cancellation worksheet without any errors and drawing 3 images). Pt with deficits saying Rt Lower quadrant and states "its better but its blurry" Pt instructed to keep worksheet on table and do not move worksheet. Pt compensating by shifting weight in chair Rt and Lt to place images within visual field.   Vision/Perception  Vision - History Baseline Vision: No visual deficits Patient Visual Report: Blurring of vision Vision - Assessment Eye Alignment: Within Functional Limits Vision Assessment: Vision tested Ocular Range of Motion: Within Functional Limits Tracking/Visual Pursuits: Able to track stimulus in all quads without difficulty Convergence: Within functional limits Visual Fields: Right visual field deficit (lower quadrant) Additional Comments: see notes above of description of visual testing. Copies of test are in shade chart in progress note  section Cognition Cognition Arousal/Alertness: Awake/alert Overall Cognitive Status: Appears within functional limits for tasks assessed Orientation Level: Oriented to person;Oriented to place;Oriented to time;Disoriented X4 Sensation/Coordination Sensation Light Touch: Appears Intact (numbness in Rt hand 2nd 3rd 4th digits ) Coordination Gross Motor Movements are Fluid and Coordinated: Yes Fine Motor Movements are Fluid and Coordinated: Yes  Finger Nose Finger Test: able to complete bil UE without deficits Extremity Assessment RUE Assessment RUE Assessment: Within Functional Limits LUE Assessment LUE Assessment: Within Functional Limits Mobility  Bed Mobility Bed Mobility: No Transfers Transfers: Yes Sit to Stand: 7: Independent;With upper extremity assist;With armrests;From chair/3-in-1 Stand to Sit: 7: Independent;With upper extremity assist;To chair/3-in-1;With armrests Exercises   End of Session OT - End of Session Equipment Utilized During Treatment: Gait belt Activity Tolerance: Patient tolerated treatment well Patient left: in chair;with call bell in reach Nurse Communication: Mobility status for transfers;Mobility status for ambulation General Behavior During Session: [facility] for tasks performed Cognition: [facility] for tasks performed   Harrel Carina [facility] 01/10/2012, 10:15 AM  Pager: [phone]

## 2012-01-10 NOTE — Progress Notes (Signed)
OT Discharge Note  Patient is being discharged from OT services secondary to:    Goals met and no further therapy needs identified.  Please see latest Therapy Progress Note for current level of functioning and progress toward goals.  Progress and discharge plan and discussed with patient/caregiver and they    Agree  OT evalutation completed and recommend follow up with ophthalmologist for lower Rt quadrant visual deficits.  Shari Taylor [place]   OTR/L Pager: [phone] Office: [phone] .

## 2012-01-11 ENCOUNTER — Encounter (HOSPITAL_COMMUNITY)
Admission: EM | Disposition: A | Discharge status: Home / Self Care | Payer: Self-pay | Source: Ambulatory Visit | Attending: Neurology

## 2012-01-11 DIAGNOSIS — K219 Gastro-esophageal reflux disease without esophagitis: Secondary | ICD-10-CM | POA: Diagnosis present

## 2012-01-11 DIAGNOSIS — I10 Essential (primary) hypertension: Secondary | ICD-10-CM | POA: Diagnosis present

## 2012-01-11 DIAGNOSIS — Z8673 Personal history of transient ischemic attack (TIA), and cerebral infarction without residual deficits: Secondary | ICD-10-CM

## 2012-01-11 DIAGNOSIS — E785 Hyperlipidemia, unspecified: Secondary | ICD-10-CM

## 2012-01-11 LAB — SEDIMENTATION RATE: Sed Rate: 20 mm/hr (ref 0–22)

## 2012-01-11 LAB — HIV ANTIBODY (ROUTINE TESTING W REFLEX): HIV: NONREACTIVE

## 2012-01-11 SURGERY — ECHOCARDIOGRAM, TRANSESOPHAGEAL
Anesthesia: Moderate Sedation

## 2012-01-11 MED ORDER — CLOPIDOGREL BISULFATE 75 MG PO TABS: 75.0000 mg | ORAL_TABLET | Freq: Every day | ORAL | Status: DC

## 2012-01-11 MED ORDER — SIMVASTATIN 20 MG PO TABS: 20.0000 mg | ORAL_TABLET | Freq: Every day | ORAL | Status: DC

## 2012-01-11 NOTE — Discharge Summary (Signed)
Stroke Discharge Summary  Patient ID: ania levay   MRN: [id number]      DOB: March 31, 1956  Date of Admission: 01/09/2012 Date of Discharge: 01/11/2012  Attending Physician:  Darcella Cheshire, MD  Consulting Physician(s):     none  Patient's PCP:  No primary provider on file.  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   Past Medical History  Diagnosis Date  . Hypertension   . GERD (gastroesophageal reflux disease)   . Crohn disease    Past Surgical History  Procedure Date  . Appendectomy   . Abdominal hysterectomy   . Bowel resection    Medication List  As of 01/11/2012  9:23 AM   TAKE these medications         amLODipine 10 MG tablet   Commonly known as: NORVASC   Take 10 mg by mouth daily.      clopidogrel 75 MG tablet   Commonly known as: PLAVIX   Take 1 tablet (75 mg total) by mouth daily with breakfast.      hydrochlorothiazide 25 MG tablet   Commonly known as: HYDRODIURIL   Take 25 mg by mouth daily.      ranitidine 150 MG capsule   Commonly known as: ZANTAC   Take 150 mg by mouth 2 (two) times daily.      simvastatin 20 MG tablet   Commonly known as: ZOCOR   Take 1 tablet (20 mg total) by mouth daily at 6 PM.           LABORATORY STUDIES CBC    Component Value Date/Time   WBC 9.5 01/09/2012 0550   RBC 4.91 01/09/2012 0550   HGB 14.3 01/09/2012 0550   HCT 43.2 01/09/2012 0550   PLT 265 01/09/2012 0550   MCV 88.0 01/09/2012 0550   MCH 29.1 01/09/2012 0550   MCHC 33.1 01/09/2012 0550   RDW 13.3 01/09/2012 0550   LYMPHSABS 2.5 01/09/2012 0550   MONOABS 0.6 01/09/2012 0550   EOSABS 0.1 01/09/2012 0550   BASOSABS 0.1 01/09/2012 0550   CMP    Component Value Date/Time   NA 139 01/09/2012 0550   K 3.9 01/09/2012 0550   CL 103 01/09/2012 0550   CO2 25  01/09/2012 0550   GLUCOSE 116* 01/09/2012 0550   BUN 20 01/09/2012 0550   CREATININE 0.80 01/09/2012 0550   CALCIUM 9.9 01/09/2012 0550   PROT 7.4 01/09/2012 0550   ALBUMIN 4.3 01/09/2012 0550   AST 16 01/09/2012 0550   ALT 21 01/09/2012 0550   ALKPHOS 98 01/09/2012 0550   BILITOT 0.5 01/09/2012 0550   GFRNONAA 81* 01/09/2012 0550   GFRAA >90 01/09/2012 0550   COAGS Lab Results  Component Value Date   INR 1.00 01/09/2012   Lipid Panel    Component Value Date/Time   CHOL 189 01/10/2012 0430   TRIG 164* 01/10/2012 0430   HDL 34* 01/10/2012 0430   CHOLHDL 5.6 01/10/2012 0430   VLDL 33 01/10/2012 0430   LDLCALC 122* 01/10/2012 0430   HgbA1C  Lab Results  Component Value Date   HGBA1C 6.2* 01/10/2012   Urine Drug Screen  No results found for this basename: labopia, cocainscrnur, labbenz, amphetmu, thcu, labbarb    Alcohol Level No results found for this basename: eth   SIGNIFICANT DIAGNOSTIC STUDIES CT of the brain  01/09/2012 Hypoattenuation within the right anterior limb internal capsule may represent an age indeterminate lacunar infarction. There is no cortical based (large artery) infarction.  CT angio head 01/09/2012 1. Focal areas of hypoattenuation within the right basal ganglia are compatible with age indeterminate lacunar infarcts. 2. Normal variant circle of Willis without evidence for significant proximal stenosis, aneurysm, or branch vessel occlusion.  CT angio neck 01/09/2012 1. No significant atherosclerotic or stenotic disease in the neck. 2. Minimal irregularity of the right carotid bifurcation without significant stenosis. 3. Borderline adenopathy, left worse than right. No primary lesion is evident. This may be reactive.  MRI of the brain No acute stroke or bleed. Moderate small vessel disease along with scattered lacunes likely a manifestation of hypertensive cerebrovascular disease, premature for the patient's age of 64.  MRA of the brain See CT angio  2D Echocardiogram EF 60-65%  with no source of embolus.  Carotid Doppler See angio  CXR 01/09/2012 1. No acute cardiopulmonary abnormalities.  EKG normal EKG, normal sinus rhythm.     History of Present Illness   Shari Taylor is an 56 y.o. female history of hypertension, presenting with acute onset of visual difficulty that started 01/09/2012 at 0400. She was seen in the emergency room at [facility]. Examination there showed right lower visual field deficit. NIH stroke score was 1. Laboratory studies were unremarkable. She was given intravenous thrombolytic therapy with TPA and subsequently transferred to [facility] for further management. Vision improved with apparent resolution of visual field defect. NIH stroke score on admission was 0. CT of her head at [facility] showed no acute changes. 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 CTA on arrival at [facility] was normal, including carotids and circle of Willis. No previous history of stroke .sbtTIA. Patient has not been on antiplatelet therapy.  Hospital Course Patient tolerated t-PA without difficulty. Post t-PA imaging negative for hemorrhage. 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 etiology felt to be embolic. hypercoagulable and vasculitic labs performed. TEE recommended and scheduled; pt refused. Placed on plavix for stroke prevention. Will discharge home. Follow up with Dr. Pearlean Brownie for OP TCD bubble study and emboli monitoring as well as evoked potentials.  Patient has stroke risk factors of hyperlipidemia and hypertension. Hyperlipidemia a new dx; zocor added.  Patient with no continued stroke symptoms. Visual deficit resolved. Physical therapy, occupational therapy and speech therapy evaluated patient. No follow up therapy needs.  Patient refused TEE. Hypercoagulable labs, vasculitic panel sent results pending.  Discharge Exam  Blood  pressure 158/98, pulse 71, temperature 98.1 F (36.7 C), temperature source Oral, resp. rate 17, height 5\' 3"  (1.6 m), weight 67.4 kg (148 lb 9.4 oz), SpO2 96.00%. Physical Exam Pleasant middle aged Caucasian lady not in distress. Afebrile. Head is nontraumatic. Neck is supple without bruit. Hearing is normal. Cardiac exam no murmur or gallop. Lungs clear to auscultation. Abdomen soft nontender.  Neurological exam Awake Alert oriented x 3. Normal speech and language.eye movements full without nystagmus. Face symmetric. Tongue midline. No visual field defect on bedside confrontational exam Normal strength, tone, reflexes and coordination. Normal sensation. Gait deferred.  NIH stroke scale is 0  Discharge Diet   General thin liquids  Discharge Plan   - Disposition:  home with family - clopidogrel 75 mg orally every day for secondary stroke prevention as patient has h/o  Peptic ulcer on aspirin in past -OP TCD  bubble study with embolic monitoring and evoked potentials. Pt instructed to set up with Dr. Pearlean Brownie. - Ongoing risk factor control by Primary Care Physician. - Risk factor recommendations:  Hypertension target range 130-140/70-80 Lipid range - LDL < 100 and checked every 6 months, fasting  - Follow-up No primary provider on file. in 1 month. - Follow-up with Dr. Delia Heady in 2 months.  Signed Delia Heady, MD3/15/2013, 9:23 AM  Dr. Delia Heady, Stroke Center Medical Director, has personally reviewed chart, pertinent data, examined the patient and developed the plan of care.

## 2012-01-11 NOTE — Progress Notes (Signed)
Utilization review completed. Aariv Medlock, RN, BSN.  01/11/12  

## 2012-01-11 NOTE — Progress Notes (Signed)
Stroke Team Progress Note  HISTORY Shari Taylor is an 56 y.o. female history of hypertension, presenting with acute onset of visual difficulty that started 01/09/2012 at 0400. She was seen in the emergency room at [facility]. Examination there showed right lower visual field deficit. NIH stroke score was 1. Laboratory studies were unremarkable. She was given intravenous thrombolytic therapy with TPA and subsequently transferred to [facility] for further management. Vision improved with apparent resolution of visual field defect. NIH stroke score on admission was 0. CT of her head at [facility] showed no acute changes. 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 CTA on arrival at [facility] was normal, including carotids and circle of Willis. No previous history of stroke nor TIA. Patient has not been on antiplatelet therapy.   SUBJECTIVE No complaints. Wants to go home. MRI negative for stroke but shows nonspecific white matter hyperintensities. Patient is refusing TEE and wants to go home.  OBJECTIVE Filed Vitals:   01/10/12 1939 01/10/12 2200 01/11/12 0200 01/11/12 0600  BP:  152/98 154/82 150/98  Pulse:  75 91 81  Temp: 97.8 F (36.6 C)  97.6 F (36.4 C)   TempSrc: Oral  Oral   Resp:  21  17  Height:      Weight:      SpO2:  96% 95% 97%    CBG (last 3) No results found for this basename: GLUCAP:3 in the last 72 hours Intake/Output from previous day: 03/14 0701 - 03/15 0700 In: 300 [P.O.:300] Out: 2715 [Urine:2715]  IV Fluid Intake:     . sodium chloride    . DISCONTD: sodium chloride 75 mL/hr at 01/10/12 0800   Medications    . amLODipine  10 mg Oral Daily  . clopidogrel  75 mg Oral Q breakfast  . famotidine  20 mg Oral Daily  . fentaNYL  250 mcg Intravenous Once  . hydrochlorothiazide  25 mg Oral Daily  . midazolam  10 mg Intravenous Once  . pantoprazole  (PROTONIX) IV  40 mg Intravenous QHS  . simvastatin  20 mg Oral q1800  . DISCONTD: labetalol  10 mg Intravenous Once  PRN acetaminophen, acetaminophen, benzocaine, iohexol, LORazepam, ondansetron (ZOFRAN) IV, senna-docusate, DISCONTD: labetalol  Diet:  NPO for TEE Activity:  Out of bed DVT Prophylaxis:  SCDs   Significant Diagnostic Studies: CBC    Component Value Date/Time   WBC 9.5 01/09/2012 0550   RBC 4.91 01/09/2012 0550   HGB 14.3 01/09/2012 0550   HCT 43.2 01/09/2012 0550   PLT 265 01/09/2012 0550   MCV 88.0 01/09/2012 0550   MCH 29.1 01/09/2012 0550   MCHC 33.1 01/09/2012 0550   RDW 13.3 01/09/2012 0550   LYMPHSABS 2.5 01/09/2012 0550   MONOABS 0.6 01/09/2012 0550   EOSABS 0.1 01/09/2012 0550   BASOSABS 0.1 01/09/2012 0550   CMP    Component Value Date/Time   NA 139 01/09/2012 0550   K 3.9 01/09/2012 0550   CL 103 01/09/2012 0550   CO2 25 01/09/2012 0550   GLUCOSE 116* 01/09/2012 0550   BUN 20 01/09/2012 0550   CREATININE 0.80 01/09/2012 0550   CALCIUM 9.9 01/09/2012 0550   PROT 7.4 01/09/2012 0550   ALBUMIN 4.3 01/09/2012 0550   AST 16 01/09/2012 0550   ALT 21 01/09/2012 0550   ALKPHOS 98 01/09/2012 0550   BILITOT 0.5 01/09/2012 0550   GFRNONAA 81* 01/09/2012 0550  GFRAA >90 01/09/2012 0550   COAGS Lab Results  Component Value Date   INR 1.00 01/09/2012   Lipid Panel    Component Value Date/Time   CHOL 189 01/10/2012 0430   TRIG 164* 01/10/2012 0430   HDL 34* 01/10/2012 0430   CHOLHDL 5.6 01/10/2012 0430   VLDL 33 01/10/2012 0430   LDLCALC 122* 01/10/2012 0430   HgbA1C  Lab Results  Component Value Date   HGBA1C 6.2* 01/10/2012   Urine Drug Screen  No results found for this basename: labopia,  cocainscrnur,  labbenz,  amphetmu,  thcu,  labbarb    Alcohol Level No results found for this basename: eth   MRSA neg   CT of the brain  01/09/2012 Hypoattenuation within the right anterior limb internal capsule may represent an age indeterminate lacunar infarction.  There is no  cortical based (large artery) infarction.   CT angio head 01/09/2012  1.  Focal areas of hypoattenuation within the right basal ganglia are compatible with age indeterminate lacunar infarcts. 2.  Normal variant circle of Willis without evidence for significant proximal stenosis, aneurysm, or branch vessel occlusion.   CT angio neck  01/09/2012 1.  No significant atherosclerotic or stenotic disease in the neck. 2.  Minimal irregularity of the right carotid bifurcation without significant stenosis. 3.  Borderline adenopathy, left worse than right.  No primary lesion is evident.  This may be reactive.    MRI of the brain  No acute stroke or bleed. 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  MRA of the brain  See CT angio  2D Echocardiogram  EF 60-65% with no source of embolus.   Carotid Doppler  See angio  CXR  01/09/2012 1.  No acute cardiopulmonary abnormalities.   EKG  normal EKG, normal sinus rhythm.   Physical Exam   Pleasant middle aged Caucasian lady not in distress. Afebrile. Head is nontraumatic. Neck is supple without bruit. Hearing is normal. Cardiac exam no murmur or gallop. Lungs clear to auscultation. Abdomen soft nontender.  Neurological exam Awake  Alert oriented x 3. Normal speech and language.eye movements full without nystagmus. Face symmetric. Tongue midline. No visual field defect on bedside confrontational exam Normal strength, tone, reflexes and coordination. Normal sensation. Gait deferred.  NIH stroke scale is 0  ASSESSMENT Shari Taylor is a 56 y.o. female with a left PCA TIA with transient right visual deficit, no stroke on MRI. History not consistent with migraine.. Concerned for embolic, status post IV t-PA 1/61/0960 at 0651. On Plavix for stroke prevention. No outpatient therapy recommended.  -CT head shows a possible silent right basal  ganglia infarcts of remote age but MRI shows only nonspecific white matter hyperintensities and no acute infarct -hypertension -GERD -Hyperlipidemia, on zocor  Hospital day # 2  TREATMENT/PLAN -TEE today patient refusing and wants to go home. Will check outpatient TCD bubble study and emboli monitoring  And evoked potentials. -transfer to the floor,  D/c home -Check  hypercoagulable panel labs, vasculitis labs.(ordered)

## 2012-01-11 NOTE — Discharge Instructions (Signed)
STROKE/TIA DISCHARGE INSTRUCTIONS SMOKING Cigarette smoking nearly doubles your risk of having a stroke & is the single most alterable risk factor  If you smoke or have smoked in the last 12 months, you are advised to quit smoking for your health.  Most of the excess cardiovascular risk related to smoking disappears within a year of stopping.  Ask you doctor about anti-smoking medications  [place] Quit Line: 1-800-QUIT NOW  Free Smoking Cessation Classes [phone]  CHOLESTEROL Know your levels; limit fat & cholesterol in your diet  Lipid Panel     Component Value Date/Time   CHOL 189 01/10/2012 0430   TRIG 164* 01/10/2012 0430   HDL 34* 01/10/2012 0430   CHOLHDL 5.6 01/10/2012 0430   VLDL 33 01/10/2012 0430   LDLCALC 122* 01/10/2012 0430      Many patients benefit from treatment even if their cholesterol is at goal.  Goal: Total Cholesterol (CHOL) less than 160  Goal:  Triglycerides (TRIG) less than 150  Goal:  HDL greater than 40  Goal:  LDL (LDLCALC) less than 100   BLOOD PRESSURE American Stroke Association blood pressure target is less that 120/80 mm/Hg  Your discharge blood pressure is:  BP: 158/98 mmHg  Monitor your blood pressure  Limit your salt and alcohol intake  Many individuals will require more than one medication for high blood pressure  DIABETES (A1c is a blood sugar average for last 3 months) Goal HGBA1c is under 7% (HBGA1c is blood sugar average for last 3 months)  Diabetes: No known diagnosis of diabetes    Lab Results  Component Value Date   HGBA1C 6.2* 01/10/2012     Your HGBA1c can be lowered with medications, healthy diet, and exercise.  Check your blood sugar as directed by your physician  Call your physician if you experience unexplained or low blood sugars.  PHYSICAL ACTIVITY/REHABILITATION Goal is 30 minutes at least 4 days per week    Activity:   No restrictions.  Activity decreases your risk of heart attack and stroke and makes your heart  stronger.  It helps control your weight and blood pressure; helps you relax and can improve your mood.  Participate in a regular exercise program.  Talk with your doctor about the best form of exercise for you (dancing, walking, swimming, cycling).  DIET/WEIGHT Goal is to maintain a healthy weight  Your discharge diet is: General thin liquids Your height is:  Height: 5\' 3"  (160 cm) Your current weight is: Weight: 67.4 kg (148 lb 9.4 oz) Your Body Mass Index (BMI) is:  BMI (Calculated): 26.4   Following the type of diet specifically designed for you will help prevent another stroke.  Your goal weight range is:  ***  Your goal Body Mass Index (BMI) is 19-24.  Healthy food habits can help reduce 3 risk factors for stroke:  High cholesterol, hypertension, and excess weight.  RESOURCES Stroke/Support Group:  Call [phone]  they meet the 3rd Sunday of the month on the Rehab Unit at [facility], [place] ( no meetings June, July & Aug).  STROKE EDUCATION PROVIDED/REVIEWED AND GIVEN TO PATIENT Stroke warning signs and symptoms How to activate emergency medical system (call 911). Medications prescribed at discharge. Need for follow-up after discharge. Personal risk factors for stroke. Pneumonia vaccine given:   {STROKE DC YES/NO/DATE:22363} Flu vaccine given:   {STROKE DC YES/NO/DATE:22363} My questions have been answered, the writing is legible, and I understand these instructions.  I will adhere to these goals &  educational materials that have been provided to me after my discharge from the hospital.

## 2012-01-11 NOTE — Progress Notes (Signed)
Discharge instructions given. Stroke education completed. Prescriptions given to patient. Pt. Is waiting on family to come pick her up at 1:00pm.

## 2012-01-12 LAB — C4 COMPLEMENT: Complement C4, Body Fluid: 26 mg/dL (ref 10–40)

## 2012-01-12 LAB — C3 COMPLEMENT: C3 Complement: 182 mg/dL — ABNORMAL HIGH (ref 90–180)

## 2012-01-13 LAB — COMPLEMENT, TOTAL: Compl, Total (CH50): >60 U/mL — ABNORMAL HIGH (ref 31–60)

## 2012-01-14 LAB — CARDIOLIPIN ANTIBODIES, IGG, IGM, IGA
Anticardiolipin IgA: 18 APL U/mL (ref <22)
Anticardiolipin IgG: 12 GPL U/mL (ref <23)

## 2012-01-14 LAB — PROTEIN C, TOTAL: Protein C, Total: 131 % (ref 72–160)

## 2012-01-14 LAB — LUPUS ANTICOAGULANT PANEL
DRVVT: 43.1 secs — ABNORMAL HIGH (ref 34.1–42.2)
PTT Lupus Anticoagulant: 41.7 secs (ref 28.0–43.0)
dRVVT Incubated 1:1 Mix: 38.7 secs (ref 34.1–42.2)

## 2012-01-14 LAB — ANA: Anti Nuclear Antibody(ANA): POSITIVE — AB

## 2012-09-14 ENCOUNTER — Emergency Department (HOSPITAL_COMMUNITY): Payer: 59

## 2012-09-14 ENCOUNTER — Observation Stay (HOSPITAL_COMMUNITY)
Admission: EM | Admit: 2012-09-14 | Discharge: 2012-09-17 | Disposition: A | Discharge status: Home / Self Care | Payer: 59 | Attending: Internal Medicine | Admitting: Internal Medicine

## 2012-09-14 ENCOUNTER — Observation Stay (HOSPITAL_COMMUNITY): Payer: 59

## 2012-09-14 ENCOUNTER — Encounter (HOSPITAL_COMMUNITY): Payer: Self-pay | Admitting: Emergency Medicine

## 2012-09-14 DIAGNOSIS — E785 Hyperlipidemia, unspecified: Secondary | ICD-10-CM | POA: Diagnosis present

## 2012-09-14 DIAGNOSIS — I10 Essential (primary) hypertension: Secondary | ICD-10-CM | POA: Diagnosis present

## 2012-09-14 DIAGNOSIS — I639 Cerebral infarction, unspecified: Secondary | ICD-10-CM

## 2012-09-14 DIAGNOSIS — Z8673 Personal history of transient ischemic attack (TIA), and cerebral infarction without residual deficits: Secondary | ICD-10-CM

## 2012-09-14 DIAGNOSIS — I6350 Cerebral infarction due to unspecified occlusion or stenosis of unspecified cerebral artery: Principal | ICD-10-CM | POA: Insufficient documentation

## 2012-09-14 DIAGNOSIS — R209 Unspecified disturbances of skin sensation: Secondary | ICD-10-CM | POA: Insufficient documentation

## 2012-09-14 DIAGNOSIS — K219 Gastro-esophageal reflux disease without esophagitis: Secondary | ICD-10-CM | POA: Diagnosis present

## 2012-09-14 DIAGNOSIS — R29898 Other symptoms and signs involving the musculoskeletal system: Secondary | ICD-10-CM | POA: Diagnosis present

## 2012-09-14 DIAGNOSIS — Z23 Encounter for immunization: Secondary | ICD-10-CM | POA: Insufficient documentation

## 2012-09-14 DIAGNOSIS — R269 Unspecified abnormalities of gait and mobility: Secondary | ICD-10-CM | POA: Insufficient documentation

## 2012-09-14 HISTORY — DX: Personal history of other diseases of the digestive system: Z87.19

## 2012-09-14 HISTORY — DX: Cerebral infarction, unspecified: I63.9

## 2012-09-14 HISTORY — DX: Other specified postprocedural states: Z98.890

## 2012-09-14 HISTORY — DX: Esophageal obstruction: K22.2

## 2012-09-14 HISTORY — DX: Other specified postprocedural states: R11.2

## 2012-09-14 LAB — CBC WITH DIFFERENTIAL/PLATELET
Eosinophils Absolute: 0.1 10*3/uL (ref 0.0–0.7)
Eosinophils Relative: 1 % (ref 0–5)
Lymphs Abs: 2.8 10*3/uL (ref 0.7–4.0)
MCH: 29.2 pg (ref 26.0–34.0)
MCV: 88.8 fL (ref 78.0–100.0)
Platelets: 249 10*3/uL (ref 150–400)
RDW: 13.5 % (ref 11.5–15.5)

## 2012-09-14 LAB — BASIC METABOLIC PANEL
Calcium: 9.1 mg/dL (ref 8.4–10.5)
Creatinine, Ser: 0.7 mg/dL (ref 0.50–1.10)
GFR calc non Af Amer: >90 mL/min (ref >90)
Glucose, Bld: 108 mg/dL — ABNORMAL HIGH (ref 70–99)
Sodium: 141 mEq/L (ref 135–145)

## 2012-09-14 MED ORDER — SODIUM CHLORIDE 0.9 % IJ SOLN
3.0000 mL | Freq: Two times a day (BID) | INTRAMUSCULAR | Status: DC
  Administered 2012-09-15 – 2012-09-16 (×3): 3 mL via INTRAVENOUS

## 2012-09-14 MED ORDER — IRBESARTAN 75 MG PO TABS
75.0000 mg | ORAL_TABLET | Freq: Every day | ORAL | Status: DC
  Administered 2012-09-14 – 2012-09-17 (×4): 75 mg via ORAL
  Filled 2012-09-14 (×4): qty 1

## 2012-09-14 MED ORDER — ONDANSETRON HCL 4 MG/2ML IJ SOLN: 4.0000 mg | Freq: Three times a day (TID) | INTRAMUSCULAR | Status: DC | PRN

## 2012-09-14 MED ORDER — AMLODIPINE BESYLATE 10 MG PO TABS
10.0000 mg | ORAL_TABLET | Freq: Every day | ORAL | Status: DC
  Administered 2012-09-14 – 2012-09-17 (×4): 10 mg via ORAL
  Filled 2012-09-14 (×4): qty 1

## 2012-09-14 MED ORDER — SODIUM CHLORIDE 0.9 % IJ SOLN
3.0000 mL | INTRAMUSCULAR | Status: DC | PRN
  Administered 2012-09-15 – 2012-09-16 (×2): 3 mL via INTRAVENOUS

## 2012-09-14 MED ORDER — SODIUM CHLORIDE 0.9 % IV SOLN: 250.0000 mL | INTRAVENOUS | Status: DC | PRN

## 2012-09-14 MED ORDER — CLOPIDOGREL BISULFATE 75 MG PO TABS
75.0000 mg | ORAL_TABLET | Freq: Every day | ORAL | Status: DC
  Administered 2012-09-15 – 2012-09-17 (×3): 75 mg via ORAL
  Filled 2012-09-14 (×5): qty 1

## 2012-09-14 MED ORDER — ASPIRIN EC 81 MG PO TBEC
81.0000 mg | DELAYED_RELEASE_TABLET | Freq: Every day | ORAL | Status: DC
  Administered 2012-09-14 – 2012-09-16 (×3): 81 mg via ORAL
  Filled 2012-09-14 (×3): qty 1

## 2012-09-14 MED ORDER — SIMVASTATIN 20 MG PO TABS
20.0000 mg | ORAL_TABLET | Freq: Every day | ORAL | Status: DC
  Administered 2012-09-15 – 2012-09-16 (×2): 20 mg via ORAL
  Filled 2012-09-14 (×3): qty 1

## 2012-09-14 MED ORDER — SODIUM CHLORIDE 0.9 % IJ SOLN
3.0000 mL | Freq: Two times a day (BID) | INTRAMUSCULAR | Status: DC
  Administered 2012-09-14 – 2012-09-16 (×2): 3 mL via INTRAVENOUS

## 2012-09-14 NOTE — ED Provider Notes (Signed)
History     CSN: [id number]  Arrival date & time 09/14/12  1408   First MD Initiated Contact with Patient 09/14/12 1617      Chief Complaint  Patient presents with  . Weakness    (Consider location/radiation/quality/duration/timing/severity/associated sxs/prior treatment) HPI Comments: The patient states she woke up last Tuesday 5 days ago and initially thought both legs were weak, she then localized at her right leg. It is unchanged since it began 5 days ago. She states she's had balance problems because of right leg weakness but denies any vertigo. She denies headache. Today she states that her blood pressure was high in the 160s to 180s systolic and this is what concerned her along with her right leg weakness. Denies any paresthesias or weakness elsewhere. Denies any vision change. Denies trauma.  Patient is a 56 y.o. female presenting with weakness. The history is provided by the patient. No language interpreter was used.  Weakness The primary symptoms include focal weakness. Primary symptoms do not include headaches, syncope, loss of consciousness, altered mental status, seizures, dizziness, visual change, paresthesias, loss of sensation, speech change, memory loss, fever, nausea or vomiting. The symptoms began 5 to 7 days ago. The episode lasted 5 days. The symptoms are unchanged. The neurological symptoms are focal.  Weakness began greater than 24 hours ago. The weakness is unchanged. There is decreased muscle function with maximum physical effort.  There is weakness in these regions/motions: right thigh flexion, right plantarflexion, right thigh extension and right hip flexion. There is impairment of the following actions: walking.  Additional symptoms include weakness (right leg) and loss of balance (difficulty 2/2 weakness). Additional symptoms do not include neck stiffness. Medical issues also include cerebral vascular accident and hypertension. Workup history includes MRI and CT  scan.    Past Medical History  Diagnosis Date  . Hypertension   . GERD (gastroesophageal reflux disease)   . Crohn disease     Past Surgical History  Procedure Date  . Appendectomy   . Abdominal hysterectomy   . Bowel resection     No family history on file.  History  Substance Use Topics  . Smoking status: Never Smoker   . Smokeless tobacco: Not on file  . Alcohol Use: Yes    OB History    Grav Para Term Preterm Abortions TAB SAB Ect Mult Living                  Review of Systems  Constitutional: Negative for fever, chills, activity change and appetite change.  HENT: Negative for congestion, rhinorrhea, neck pain, neck stiffness and sinus pressure.   Eyes: Negative for discharge and visual disturbance.  Respiratory: Negative for cough, chest tightness, shortness of breath, wheezing and stridor.   Cardiovascular: Negative for chest pain, leg swelling and syncope.  Gastrointestinal: Negative for nausea, vomiting, abdominal pain, diarrhea and abdominal distention.  Genitourinary: Negative for decreased urine volume and difficulty urinating.  Musculoskeletal: Positive for gait problem. Negative for back pain and arthralgias.  Skin: Negative for color change and pallor.  Neurological: Positive for focal weakness, weakness (right leg) and loss of balance (difficulty 2/2 weakness). Negative for dizziness, speech change, seizures, loss of consciousness, light-headedness, headaches and paresthesias.  Psychiatric/Behavioral: Negative for memory loss, behavioral problems, agitation and altered mental status.  All other systems reviewed and are negative.    Allergies  Penicillins; Beef-derived products; and Clindamycin/lincomycin  Home Medications   Current Outpatient Rx  Name  Route  Sig  Dispense  Refill  . GAVISCON PO   Oral   Take 1 tablet by mouth daily as needed. For indigestion/reflux         . AMLODIPINE BESYLATE 10 MG PO TABS   Oral   Take 10 mg by mouth  daily.         . ASPIRIN EC 81 MG PO TBEC   Oral   Take 81 mg by mouth daily.         Marland Kitchen HYDROCORTISONE ACE-PRAMOXINE 1-1 % RE FOAM   Rectal   Place 1 applicator rectally 2 (two) times daily as needed. For pain         . RANITIDINE HCL 150 MG PO CAPS   Oral   Take 150 mg by mouth 2 (two) times daily as needed. For reflux         . SIMVASTATIN 20 MG PO TABS   Oral   Take 20 mg by mouth daily at 6 PM.         . VALSARTAN 80 MG PO TABS   Oral   Take 80-160 mg by mouth 2 (two) times daily. Take 2 tablets in AM and 1 tablet in PM           BP 192/90  Pulse 60  Temp 98.2 F (36.8 C) (Oral)  Resp 16  SpO2 98%  Physical Exam  Nursing note and vitals reviewed. Constitutional: She is oriented to person, place, and time. She appears well-developed and well-nourished. No distress.  HENT:  Head: Normocephalic and atraumatic.  Mouth/Throat: No oropharyngeal exudate.  Eyes: EOM are normal. Pupils are equal, round, and reactive to light. Right eye exhibits no discharge. Left eye exhibits no discharge.  Neck: Normal range of motion. Neck supple. No JVD present.  Cardiovascular: Normal rate, regular rhythm and normal heart sounds.   Pulmonary/Chest: Effort normal and breath sounds normal. No stridor. No respiratory distress. She exhibits no tenderness.  Abdominal: Soft. Bowel sounds are normal. She exhibits no distension. There is no tenderness. There is no guarding.  Musculoskeletal: Normal range of motion. She exhibits no edema and no tenderness.  Neurological: She is alert and oriented to person, place, and time. She displays normal reflexes. No cranial nerve deficit. She exhibits abnormal muscle tone. Coordination (normal finger to nose) normal.       3/5 hip flexion, knee extension, knee flexion, ankle plantar flexion. 5 out of 5 ankle dorsiflexion. No sensory deficits.  Skin: Skin is warm and dry. No rash noted. She is not diaphoretic.  Psychiatric: She has a normal mood  and affect. Her behavior is normal. Judgment and thought content normal.    ED Course  Procedures (including critical care time)  Labs Reviewed  BASIC METABOLIC PANEL - Abnormal; Notable for the following:    Glucose, Bld 108 (*)     All other components within normal limits  CBC WITH DIFFERENTIAL   Ct Head Wo Contrast  09/14/2012  *RADIOLOGY REPORT*  Clinical Data: Weakness from waist down began 5 days ago, now with weakness remaining in right leg, elevated blood pressure, history hypertension, stroke, Crohn's disease, GERD  CT HEAD WITHOUT CONTRAST  Technique:  Contiguous axial images were obtained from the base of the skull through the vertex without contrast.  Comparison: 01/10/2012  Findings: Normal ventricular morphology. No midline shift or mass effect. Old appearing left parietal periventricular white matter infarcts though new since previous exam. Old lacunar infarcts anterior right basal ganglia. No intracranial hemorrhage, mass lesion or evidence  of acute infarction. No extra-axial fluid collections. Bones and sinuses unremarkable.  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 No acute intracranial abnormalities.   Original Report Authenticated By: Ulyses Southward, M.D.      1. Right leg weakness   2. GERD (gastroesophageal reflux disease)   3. HTN (hypertension)   4. Transient ischemic attack (TIA), and cerebral infarction without residual deficits       MDM  5:54 PM the patient has new onset right leg weakness which is concerning for stroke. She is 5 days out and as well out of the TPA window. CT does not show any acute infarct or changes, will consult neuro and will likely need an MRI as an inpatient. Stable.  Admitted in stable condition for MR. Neuro to see pt also. No changes since she has been in ED.      Warrick Parisian, MD 09/15/12 [phone]

## 2012-09-14 NOTE — ED Notes (Signed)
Pt reports began with weakness from waist down on Tuesday. Pt reports now weakness remains in right leg. Pt also c/o having high blood pressure. Pt had stroke in March. Grips equal. Pt has weakness to right leg.

## 2012-09-14 NOTE — ED Notes (Signed)
EDP and Resident informed that MRI not currently here. Plan of care being discussed.

## 2012-09-14 NOTE — ED Notes (Signed)
Pt return from CT scan.

## 2012-09-14 NOTE — H&P (Signed)
Chief Complaint:  rle weakness  HPI: 56 yo female h/o cva 3 months ago s/p tpa at that time with mild residual scotaoma eyesight comes in with 5 days of persistetn rle weakness/numbness no back pain no hip pain.  No other focal neuro deficitis.  Has been walking with a limp.  No fevers.  No n/v/d.  No recent illnesses in the last month.  No pain buttock.  Neuro recommended mri which cannot be done tonight.  Is taking asa and plavix.  Review of Systems:  O/w neg  Past Medical History: Past Medical History  Diagnosis Date  . Hypertension   . GERD (gastroesophageal reflux disease)   . Crohn disease    Past Surgical History  Procedure Date  . Appendectomy   . Abdominal hysterectomy   . Bowel resection     Medications: Prior to Admission medications   Medication Sig Start Date End Date Taking? Authorizing Provider  Alum Hydroxide-Mag Carbonate (GAVISCON PO) Take 1 tablet by mouth daily as needed. For indigestion/reflux   Yes Historical Provider, MD  amLODipine (NORVASC) 10 MG tablet Take 10 mg by mouth daily.   Yes Historical Provider, MD  aspirin EC 81 MG tablet Take 81 mg by mouth daily.   Yes Historical Provider, MD  hydrocortisone-pramoxine [facility]) rectal foam Place 1 applicator rectally 2 (two) times daily as needed. For pain   Yes Historical Provider, MD  ranitidine (ZANTAC) 150 MG capsule Take 150 mg by mouth 2 (two) times daily as needed. For reflux   Yes Historical Provider, MD  simvastatin (ZOCOR) 20 MG tablet Take 20 mg by mouth daily at 6 PM. 01/11/12 01/10/13 Yes Layne Benton, NP  valsartan (DIOVAN) 80 MG tablet Take 80-160 mg by mouth 2 (two) times daily. Take 2 tablets in AM and 1 tablet in PM   Yes Historical Provider, MD    Allergies:   Allergies  Allergen Reactions  . Penicillins Anaphylaxis  . Beef-Derived Products Other (See Comments)    Patient does not eat beef at all  . Clindamycin/Lincomycin     Intestinal bleeding    Social History:  reports  that she has never smoked. She does not have any smokeless tobacco history on file. She reports that she drinks alcohol. She reports that she does not use illicit drugs.  Family History: negative  Physical Exam: Filed Vitals:   09/14/12 1759 09/14/12 1815 09/14/12 1830 09/14/12 1945  BP: 166/90 154/79 152/88 154/70  Pulse: 60 60 56 55  Temp:      TempSrc:      Resp: 18 18    SpO2: 98% 98% 96% 98%   General appearance: alert, cooperative and no distress Lungs: clear to auscultation bilaterally Heart: regular rate and rhythm, S1, S2 normal, no murmur, click, rub or gallop Abdomen: soft, non-tender; bowel sounds normal; no masses,  no organomegaly Extremities: extremities normal, atraumatic, no cyanosis or edema Pulses: 2+ and symmetric Skin: Skin color, texture, turgor normal. No rashes or lesions Neurologic: 3/5 strength rle normal all others.  Cn 2 thru 12 intact    Labs on Admission:   [facility] 09/14/12 1745  NA 141  K 3.6  CL 105  CO2 22  GLUCOSE 108*  BUN 14  CREATININE 0.70  CALCIUM 9.1  MG --  PHOS --    Basename 09/14/12 1745  WBC 10.1  NEUTROABS 6.6  HGB 13.5  HCT 41.1  MCV 88.8  PLT 249    Radiological Exams on Admission: Ct Head  Wo Contrast  09/14/2012  *RADIOLOGY REPORT*  Clinical Data: Weakness from waist down began 5 days ago, now with weakness remaining in right leg, elevated blood pressure, history hypertension, stroke, Crohn's disease, GERD  CT HEAD WITHOUT CONTRAST  Technique:  Contiguous axial images were obtained from the base of the skull through the vertex without contrast.  Comparison: 01/10/2012  Findings: Normal ventricular morphology. No midline shift or mass effect. Old appearing left parietal periventricular white matter infarcts though new since previous exam. Old lacunar infarcts anterior right basal ganglia. No intracranial hemorrhage, mass lesion or evidence of acute infarction. No extra-axial fluid collections. Bones and sinuses  unremarkable.  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 No acute intracranial abnormalities.   Original Report Authenticated By: Ulyses Southward, M.D.     Assessment/Plan 56 yo female with rle weakness/numbness unclear etiology  Principal Problem:  *Weakness of right leg Active Problems:  Transient ischemic attack (TIA), and cerebral infarction without residual deficits  Cerebral infarct  HTN (hypertension)  Mri ina m.  Also ck l spine series xrays.  Neurology consulted.  Cont asa/plavix.  Further recs per neuro.  Neuro cks overnight.  ekg nsr.     Emmie Frakes A 09/14/2012, 8:24 PM

## 2012-09-15 ENCOUNTER — Observation Stay (HOSPITAL_COMMUNITY): Payer: 59

## 2012-09-15 DIAGNOSIS — I6350 Cerebral infarction due to unspecified occlusion or stenosis of unspecified cerebral artery: Secondary | ICD-10-CM

## 2012-09-15 MED ORDER — INFLUENZA VIRUS VACC SPLIT PF IM SUSP
0.2500 mL | INTRAMUSCULAR | Status: DC
  Filled 2012-09-15: qty 0.25

## 2012-09-15 MED ORDER — INFLUENZA VIRUS VACC SPLIT PF IM SUSP
0.5000 mL | INTRAMUSCULAR | Status: AC
  Administered 2012-09-15: 0.5 mL via INTRAMUSCULAR
  Filled 2012-09-15 (×2): qty 0.5

## 2012-09-15 MED ORDER — LORAZEPAM 2 MG/ML IJ SOLN
INTRAMUSCULAR | Status: AC
  Administered 2012-09-15: 1 mg via INTRAVENOUS
  Filled 2012-09-15: qty 1

## 2012-09-15 MED ORDER — LORAZEPAM 2 MG/ML IJ SOLN
1.0000 mg | Freq: Once | INTRAMUSCULAR | Status: AC
  Administered 2012-09-15: 1 mg via INTRAVENOUS

## 2012-09-15 NOTE — Progress Notes (Signed)
[facility] PROGRESS NOTE  Shari Taylor ZOX:[id number] DOB: 16-Aug-1956 DOA: 09/14/2012 PCP: No primary provider on file.  Assessment/Plan: Active Problems Cerebral infarct -MRI with Small region of acute infarction affecting the left basal ganglia  and posterior limb internal capsule - stroke team to follow for further recs on if echo and carotid dopplers need to be repeated, pt states she had these done in march with her other stroke and would rather not repeat them - consult PT/OT HTN (hypertension) - continue current meds, ok BP control so far today  Code Status:full Family Communication: family at bedside Disposition Plan: pending PT   Consultants:  neuro  Procedures:  none  Antibiotics:  none)  HPI/Subjective: States RLE weakness unchanged, denies new c/o  Objective: Filed Vitals:   09/15/12 0826 09/15/12 0950 09/15/12 1421 09/15/12 1822  BP: 145/75 162/82 126/84 134/67  Pulse: 67 76 70 74  Temp: 97.9 F (36.6 C) 97.8 F (36.6 C) 97.7 F (36.5 C) 98.1 F (36.7 C)  TempSrc: Oral Oral Oral Oral  Resp: 18 18 18 18   Height:      Weight:      SpO2: 96% 100% 96% 95%    Intake/Output Summary (Last 24 hours) at 09/15/12 2113 Last data filed at 09/15/12 1700  Gross per 24 hour  Intake    720 ml  Output      0 ml  Net    720 ml   Filed Weights   09/14/12 2037  Weight: 68.13 kg (150 lb 3.2 oz)    Exam:   General:  A&o x3, Middle aged female in NAD  Cardiovascular: RRR nl S1S2  Respiratory: clear  Abdomen:soft, +BS, NT/ND  Neuro: R leg weakness, flaccid distally  Data Reviewed: Basic Metabolic Panel:  Lab 09/14/12 8119  NA 141  K 3.6  CL 105  CO2 22  GLUCOSE 108*  BUN 14  CREATININE 0.70  CALCIUM 9.1  MG --  PHOS --   Liver Function Tests: No results found for this basename: AST:5,ALT:5,ALKPHOS:5,BILITOT:5,PROT:5,ALBUMIN:5 in the last 168 hours No results found for this basename: LIPASE:5,AMYLASE:5 in the last 168  hours No results found for this basename: AMMONIA:5 in the last 168 hours CBC:  Lab 09/14/12 1745  WBC 10.1  NEUTROABS 6.6  HGB 13.5  HCT 41.1  MCV 88.8  PLT 249   Cardiac Enzymes: No results found for this basename: CKTOTAL:5,CKMB:5,CKMBINDEX:5,TROPONINI:5 in the last 168 hours BNP (last 3 results) No results found for this basename: PROBNP:3 in the last 8760 hours CBG: No results found for this basename: GLUCAP:5 in the last 168 hours  No results found for this or any previous visit (from the past 240 hour(s)).   Studies: Ct Head Wo Contrast  09/14/2012  *RADIOLOGY REPORT*  Clinical Data: Weakness from waist down began 5 days ago, now with weakness remaining in right leg, elevated blood pressure, history hypertension, stroke, Crohn's disease, GERD  CT HEAD WITHOUT CONTRAST  Technique:  Contiguous axial images were obtained from the base of the skull through the vertex without contrast.  Comparison: 01/10/2012  Findings: Normal ventricular morphology. No midline shift or mass effect. Old appearing left parietal periventricular white matter infarcts though new since previous exam. Old lacunar infarcts anterior right basal ganglia. No intracranial hemorrhage, mass lesion or evidence of acute infarction. No extra-axial fluid collections. Bones and sinuses unremarkable.  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 No acute intracranial abnormalities.   Original Report Authenticated By:  Ulyses Southward, M.D.    Mr Brain Wo Contrast  09/15/2012  *RADIOLOGY REPORT*  Clinical Data: Right leg weakness.  Previous stroke.  MRI HEAD WITHOUT CONTRAST  Technique:  Multiplanar, multiecho pulse sequences of the brain and surrounding structures were obtained according to standard protocol without intravenous contrast.  Comparison: Head CT 11/17.  MRI 01/10/2012.  Findings: There is acute infarction affecting the left basal ganglia, there is a small region of  acute infarction affecting the left basal ganglia and posterior limb internal capsule.  No evidence of swelling, mass effect or hemorrhage.  The brainstem and cerebellum are normal.  There are old lacunar infarctions affecting the thalami, basal ganglia and hemispheric deep white matter.  No cortical or large vessel territory infarction.  No mass lesion, hemorrhage, hydrocephalus or extra-axial collection.  No pituitary mass.  No inflammatory sinus disease.  No skull or skull base lesion.  IMPRESSION:  Small region of acute infarction affecting the left basal ganglia and posterior limb internal capsule.  No swelling, mass effect or hemorrhage.  Old small vessel infarctions as outlined above.   Original Report Authenticated By: Paulina Fusi, M.D.    Dg Lumbar Spine 2-3vclearing  09/15/2012  *RADIOLOGY REPORT*  Clinical Data: Weakness in the right leg, no injury, history of stroke previously  LIMITED LUMBAR SPINE FOR TRAUMA CLEARING - 2-3 VIEW  Comparison: None.  Findings: The lumbar vertebrae are in normal alignment.  Only L5-S1 disc space is slightly narrowed.  There does appear to be degenerative change involving facet joints particularly of L4-5 and L5-S1.  The SI joints appear normally corticated.  Surgical clips overlie the right abdomen.  IMPRESSION:  1.  Normal alignment. 2.  Slightly diminished disc space at L5-S1. 3.  Probable degenerative change involving the facet joints of L4-5 and L5-S1.   Original Report Authenticated By: Dwyane Dee, M.D.     Scheduled Meds:   . amLODipine  10 mg Oral Daily  . aspirin EC  81 mg Oral Daily  . clopidogrel  75 mg Oral Q breakfast  . [COMPLETED] influenza  inactive virus vaccine  0.5 mL Intramuscular Tomorrow-1000  . irbesartan  75 mg Oral Daily  . [COMPLETED] LORazepam  1 mg Intravenous Once  . simvastatin  20 mg Oral q1800  . sodium chloride  3 mL Intravenous Q12H  . sodium chloride  3 mL Intravenous Q12H  . [DISCONTINUED] influenza  inactive virus  vaccine  0.25 mL Intramuscular Tomorrow-1000   Continuous Infusions:   Principal Problem:  *Weakness of right leg Active Problems:  Transient ischemic attack (TIA), and cerebral infarction without residual deficits  Cerebral infarct  HTN (hypertension)    Time spent:    Kela Millin  [facility] Pager [phone]. If 8PM-8AM, please contact night-coverage at [email or web address], password [facility] 09/15/2012, 9:13 PM  LOS: 1 day

## 2012-09-15 NOTE — Consult Note (Signed)
Reason for Consult: Right leg weakness Referring Physician: Eldridge Dace  CC: Right leg weakness  History is obtained from:Patient  HPI: Shari Taylor is a 56 y.o. female who presented with right leg weakness and started Tuesday morning. She states that it was present on awakening, and she felt she might have slept on it funny. She subsequently did not have improvement and therefore felt like she might have had a stroke. She states that she felt that she might get better and therefore did not seek treatment. She has had no improvement and therefore has sought treatment at this time. She denies back pain, leg pain, numbness, weakness of her arm or face.  ROS: A 14 point ROS was performed and is negative except as noted in the HPI.  Past Medical History  Diagnosis Date  . Hypertension   . GERD (gastroesophageal reflux disease)   . Crohn disease     Family History: Grandmother-stroke  Social History: Tob: Denies EtOH: Rare  Exam: Current vital signs: BP 141/75  Pulse 67  Temp 97.3 F (36.3 C) (Oral)  Resp 16  Ht 5\' 3"  (1.6 m)  Wt 68.13 kg (150 lb 3.2 oz)  BMI 26.61 kg/m2  SpO2 99% Vital signs in last 24 hours: Temp:  [97.3 F (36.3 C)-98.2 F (36.8 C)] 97.3 F (36.3 C) (11/17 2300) Pulse Rate:  [55-69] 67  (11/17 2300) Resp:  [16-18] 16  (11/17 2300) BP: (141-192)/(70-90) 141/75 mmHg (11/17 2300) SpO2:  [96 %-99 %] 99 % (11/17 2300) Weight:  [68.13 kg (150 lb 3.2 oz)] 68.13 kg (150 lb 3.2 oz) (11/17 2037)  General: In bed, no apparent distress CV: Regular rate and rhythm Mental Status: Patient is awake, alert, oriented to person, place, month, year, and situation. Immediate and remote memory are intact. Patient is able to give a clear and coherent history. Cranial Nerves: II: Visual Fields are full. Pupils are equal, round, and reactive to light.  Discs are sharp. III,IV, VI: EOMI without ptosis or diploplia.  V: Facial sensation is symmetric to  temperature VII: Facial movement is symmetric.  VIII: hearing is intact to voice X: Uvula elevates symmetrically XI: Shoulder shrug is symmetric. XII: tongue is midline without atrophy or fasciculations.  Motor: Tone is increased. Bulk is normal. 5/5 strength was present in her bilateral arms and left leg. There IS a right pronator drift in her right arm, but no orbital sign. Her left leg has 4/5 weaknss proximally with some give-way component. Distally, she initially gives little resistance, but with encouragement, give 4/5 strength in dorsiflexion. 3/5 plantarflexion.  Sensory: Sensation is symmetric to light touch and temperature in the arms and legs. No change cirumferentially or length dependently in legs.  Deep Tendon Reflexes: 3+ and symmetric in the biceps, ankles, and patellae Cerebellar: FNF with no dysmetria bilaterally, performs slightly slower on right. Gait: Did not assess 2/2 weakness  I have reviewed labs in epic and the results pertinent to this consultation are: BMP, CBC WNL  I have reviewed the images obtained:CT head - old infarcts, but no acute changes.   Impression: 56 yo F with right lower extremity weakness. There is some inconsistency in her exam, however I do feel that with her risk factors and previous strokes, that she needs to be further evaluated. An MRI brain will be done tomorrow morning, if this is negative, then further spinal imaging may be indicated. Unlikely to be radicular in nature given hyperreflexia and no radicular pattern to the weakness,  also no sensory change.  Recommendations: 1) MRI brain, if CVA is present, then pursue a stroke workup 2) If MRI brain is negative, then would consider MRI C-spine and T-spine(including images through bottom of the conus) given that she does have a pronator drift and hyperreflexia.  3) Further recommendations pending MRI brain.  4) Alread on dual antiplatelet therapy with ASA/Plavix. Would continue this for now.     Ritta Slot, MD [facility] [phone]  If 7pm- 7am, please page neurology on call at [phone].

## 2012-09-15 NOTE — Progress Notes (Signed)
Subjective: No change in right lower extremity weakness. No complaints regarding right upper extremity.  Objective: Current vital signs: BP 145/75  Pulse 67  Temp 97.9 F (36.6 C) (Oral)  Resp 18  Ht 5\' 3"  (1.6 m)  Wt 68.13 kg (150 lb 3.2 oz)  BMI 26.61 kg/m2  SpO2 96%  Neurologic Exam: Alert and in no acute distress. Mental status was normal. Extraocular movements and visual fields were normal. No facial numbness and no facial weakness. Speech was normal. Mild right upper extremity pronator drift as well as mild proximal weakness on manual muscle testing. Moderately severe proximal weakness, and in severe distal weakness of right lower extremity. Muscle tone was flaccid throughout.  Lab Results: Results for orders placed during the hospital encounter of 09/14/12 (from the past 48 hour(s))  CBC WITH DIFFERENTIAL     Status: Normal   Collection Time   09/14/12  5:45 PM      Component Value Range Comment   WBC 10.1  4.0 - 10.5 K/uL    RBC 4.63  3.87 - 5.11 MIL/uL    Hemoglobin 13.5  12.0 - 15.0 g/dL    HCT 19.1  47.8 - 29.5 %    MCV 88.8  78.0 - 100.0 fL    MCH 29.2  26.0 - 34.0 pg    MCHC 32.8  30.0 - 36.0 g/dL    RDW 62.1  30.8 - 65.7 %    Platelets 249  150 - 400 K/uL    Neutrophils Relative 65  43 - 77 %    Neutro Abs 6.6  1.7 - 7.7 K/uL    Lymphocytes Relative 28  12 - 46 %    Lymphs Abs 2.8  0.7 - 4.0 K/uL    Monocytes Relative 6  3 - 12 %    Monocytes Absolute 0.6  0.1 - 1.0 K/uL    Eosinophils Relative 1  0 - 5 %    Eosinophils Absolute 0.1  0.0 - 0.7 K/uL    Basophils Relative 0  0 - 1 %    Basophils Absolute 0.0  0.0 - 0.1 K/uL   BASIC METABOLIC PANEL     Status: Abnormal   Collection Time   09/14/12  5:45 PM      Component Value Range Comment   Sodium 141  135 - 145 mEq/L    Potassium 3.6  3.5 - 5.1 mEq/L    Chloride 105  96 - 112 mEq/L    CO2 22  19 - 32 mEq/L    Glucose, Bld 108 (*) 70 - 99 mg/dL    BUN 14  6 - 23 mg/dL    Creatinine, Ser 8.46   0.50 - 1.10 mg/dL    Calcium 9.1  8.4 - 96.2 mg/dL    GFR calc non Af Amer >90  >90 mL/min    GFR calc Af Amer >90  >90 mL/min     Studies/Results: Ct Head Wo Contrast  09/14/2012  *RADIOLOGY REPORT*  Clinical Data: Weakness from waist down began 5 days ago, now with weakness remaining in right leg, elevated blood pressure, history hypertension, stroke, Crohn's disease, GERD  CT HEAD WITHOUT CONTRAST  Technique:  Contiguous axial images were obtained from the base of the skull through the vertex without contrast.  Comparison: 01/10/2012  Findings: Normal ventricular morphology. No midline shift or mass effect. Old appearing left parietal periventricular white matter infarcts though new since previous exam. Old lacunar infarcts anterior right basal ganglia. No  intracranial hemorrhage, mass lesion or evidence of acute infarction. No extra-axial fluid collections. Bones and sinuses unremarkable.  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 No acute intracranial abnormalities.   Original Report Authenticated By: Ulyses Southward, M.D.    Mr Brain Wo Contrast  09/15/2012  *RADIOLOGY REPORT*  Clinical Data: Right leg weakness.  Previous stroke.  MRI HEAD WITHOUT CONTRAST  Technique:  Multiplanar, multiecho pulse sequences of the brain and surrounding structures were obtained according to standard protocol without intravenous contrast.  Comparison: Head CT 11/17.  MRI 01/10/2012.  Findings: There is acute infarction affecting the left basal ganglia, there is a small region of acute infarction affecting the left basal ganglia and posterior limb internal capsule.  No evidence of swelling, mass effect or hemorrhage.  The brainstem and cerebellum are normal.  There are old lacunar infarctions affecting the thalami, basal ganglia and hemispheric deep white matter.  No cortical or large vessel territory infarction.  No mass lesion, hemorrhage, hydrocephalus or extra-axial  collection.  No pituitary mass.  No inflammatory sinus disease.  No skull or skull base lesion.  IMPRESSION:  Small region of acute infarction affecting the left basal ganglia and posterior limb internal capsule.  No swelling, mass effect or hemorrhage.  Old small vessel infarctions as outlined above.   Original Report Authenticated By: Paulina Fusi, M.D.    Dg Lumbar Spine 2-3vclearing  09/15/2012  *RADIOLOGY REPORT*  Clinical Data: Weakness in the right leg, no injury, history of stroke previously  LIMITED LUMBAR SPINE FOR TRAUMA CLEARING - 2-3 VIEW  Comparison: None.  Findings: The lumbar vertebrae are in normal alignment.  Only L5-S1 disc space is slightly narrowed.  There does appear to be degenerative change involving facet joints particularly of L4-5 and L5-S1.  The SI joints appear normally corticated.  Surgical clips overlie the right abdomen.  IMPRESSION:  1.  Normal alignment. 2.  Slightly diminished disc space at L5-S1. 3.  Probable degenerative change involving the facet joints of L4-5 and L5-S1.   Original Report Authenticated By: Dwyane Dee, M.D.     Medications:  Scheduled:   . amLODipine  10 mg Oral Daily  . aspirin EC  81 mg Oral Daily  . clopidogrel  75 mg Oral Q breakfast  . influenza  inactive virus vaccine  0.5 mL Intramuscular Tomorrow-1000  . irbesartan  75 mg Oral Daily  . [COMPLETED] LORazepam  1 mg Intravenous Once  . simvastatin  20 mg Oral q1800  . sodium chloride  3 mL Intravenous Q12H  . sodium chloride  3 mL Intravenous Q12H  . [DISCONTINUED] influenza  inactive virus vaccine  0.25 mL Intramuscular Tomorrow-1000   WUJ:WJXBJY chloride, sodium chloride, [DISCONTINUED] ondansetron (ZOFRAN) IV, [DISCONTINUED] ondansetron (ZOFRAN) IV  Assessment/Plan: Acute stroke involving left basal ganglia and posteriorly on both internal capsule with residual right hemiparesis affecting right lower extremity primarily. Patient has history of previous thalamic stroke in March  2013.  Recommendations: 1. Continue aspirin and Plavix daily. 2. Blood pressure control. 3. Hemoglobin A1c and fasting lipid panel. 4. Repeat cerebrovascular studies if recommended by stroke team. 5. Physical therapy and occupational therapy consults.  C.R. Roseanne Reno, MD [facility] [phone]  09/15/2012  9:46 AM

## 2012-09-16 LAB — HEMOGLOBIN A1C: Mean Plasma Glucose: 126 mg/dL — ABNORMAL HIGH (ref <117)

## 2012-09-16 LAB — LIPID PANEL
Cholesterol: 213 mg/dL — ABNORMAL HIGH (ref 0–200)
HDL: 38 mg/dL — ABNORMAL LOW (ref >39)
Total CHOL/HDL Ratio: 5.6 RATIO
Triglycerides: 209 mg/dL — ABNORMAL HIGH (ref <150)
VLDL: 42 mg/dL — ABNORMAL HIGH (ref 0–40)

## 2012-09-16 NOTE — Progress Notes (Signed)
Pt requested to shower,  Checked with stroke team, and was told not at this time, patient also requesting to go down stairs to gift shop and explained that because she was on the monitor, she couldn't go off the unit.  Unhappy with information but compliant at this time.

## 2012-09-16 NOTE — Evaluation (Signed)
Occupational Therapy Evaluation Patient Details Name: Shari Taylor MRN: [id number] DOB: [birth year] Today's Date: 09/16/2012 Time: 4098-1191 (OT out of room from 11:22- 1144) OT Time Calculation (min): 75 min  OT Assessment / Plan / Recommendation Clinical Impression  Pt presents to OT s/p CVA. Pt with decreased I with ADL activity mainly due to weakness in LLE. Pt will benefit from skilled OT to increase I with ADL activity and return to PLOF    OT Assessment  Patient needs continued OT Services    Follow Up Recommendations  Home health OT;Outpatient OT;Other (comment) (depending on progress and family support)    Barriers to Discharge Decreased caregiver support    Equipment Recommendations  None recommended by OT (possibly BSC depending on progress)      Frequency  Min 2X/week    Precautions / Restrictions Precautions Precautions: Fall Restrictions Weight Bearing Restrictions: No       ADL  Eating/Feeding: Performed;Independent Where Assessed - Eating/Feeding: Chair Grooming: Simulated;Wash/dry face;Set up Where Assessed - Grooming: Unsupported sitting Upper Body Bathing: Simulated;Set up Where Assessed - Upper Body Bathing: Unsupported sitting Lower Body Bathing: Performed;Moderate assistance Where Assessed - Lower Body Bathing: Supported sit to stand Upper Body Dressing: Simulated;Supervision/safety Where Assessed - Upper Body Dressing: Unsupported sitting Lower Body Dressing: Simulated;Moderate assistance Where Assessed - Lower Body Dressing: Supported sitting;Supported sit to stand Toilet Transfer: Simulated;Moderate assistance Toilet Transfer Method: Sit to stand;Stand pivot Toilet Transfer Equipment: Regular height toilet Toileting - Clothing Manipulation and Hygiene: Simulated;Moderate assistance Where Assessed - Toileting Clothing Manipulation and Hygiene: Standing    OT Diagnosis: Generalized weakness  OT Problem List: Decreased strength OT Treatment  Interventions: Self-care/ADL training;Patient/family education;DME and/or AE instruction   OT Goals Acute Rehab OT Goals OT Goal Formulation: With patient ADL Goals Pt Will Perform Grooming: with supervision;Standing at sink ADL Goal: Grooming - Progress: Goal set today Pt Will Perform Upper Body Dressing: with supervision;Sit to stand from chair ADL Goal: Upper Body Dressing - Progress: Goal set today Pt Will Perform Lower Body Dressing: with supervision;Sit to stand from chair ADL Goal: Lower Body Dressing - Progress: Goal set today Pt Will Transfer to Toilet: with supervision;Comfort height toilet ADL Goal: Toilet Transfer - Progress: Goal set today Pt Will Perform Toileting - Clothing Manipulation: with supervision;Standing ADL Goal: Toileting - Clothing Manipulation - Progress: Goal set today  Visit Information  Last OT Received On: 09/16/12    Subjective Data  Subjective: i cant move my left leg at all ( pt sitting on couch near window and had gotten over there alone)  educated pt on safety Patient Stated Goal: regain I   Prior Functioning     Home Living Lives With: Significant other Available Help at Discharge: Family Type of Home: House Home Layout: Two level;Able to live on main level with bedroom/bathroom Alternate Level Stairs-Number of Steps: 2 Alternate Level Stairs-Rails: None Bathroom Shower/Tub: Tub/shower unit;Walk-in shower;Door Dentist: None Prior Function Level of Independence: Independent Able to Take Stairs?: Yes Driving: Yes Vocation: Full time employment Communication Communication: No difficulties Dominant Hand: Right         Vision/Perception Vision - Assessment Eye Alignment: Within Functional Limits   Cognition  Arousal/Alertness: Awake/alert Orientation Level: Appears intact for tasks assessed Behavior During Session: [facility] for tasks performed    Extremity/Trunk Assessment Right Upper  Extremity Assessment RUE ROM/Strength/Tone: WFL for tasks assessed RUE Sensation: WFL - Light Touch RUE Coordination: WFL - gross/fine motor Left Upper Extremity Assessment LUE  ROM/Strength/Tone: [facility] for tasks assessed LUE Sensation: WFL - Light Touch LUE Coordination: WFL - gross/fine motor     Mobility Transfers Transfers: Sit to Stand;Stand to Sit Sit to Stand: 3: Mod assist Stand to Sit: 3: Mod assist              End of Session OT - End of Session Equipment Utilized During Treatment: Gait belt Activity Tolerance: Patient tolerated treatment well Patient left: in chair  GO Functional Assessment Tool Used: clinical observation Functional Limitation: Self care Self Care Current Status (Z6109): At least 40 percent but less than 60 percent impaired, limited or restricted Self Care Goal Status (U0454): At least 20 percent but less than 40 percent impaired, limited or restricted   Channah Godeaux, [place] 09/16/2012, 12:29 PM

## 2012-09-16 NOTE — Progress Notes (Signed)
[facility] PROGRESS NOTE  Shari Taylor ZOX:[id number] DOB: Sep 19, 1956 DOA: 09/14/2012 PCP: No primary provider on file.  Assessment/Plan: Active Problems Cerebral infarct -MRI with Small region of acute infarction affecting the left basal ganglia  and posterior limb internal capsule - Appreciate stroke team assistance -patient now agrees to TEE, planned for a.m. per neuro -Continue Plavix -PT/OT recommending outpatient treatment HTN (hypertension) - continue current meds, ok BP control so far today  Code Status:full Family Communication: family at bedside Disposition Plan: pending PT   Consultants:  neuro  Procedures:  none  Antibiotics:  none)  HPI/Subjective: States RLE weakness unchanged, denies new c/o  Objective: Filed Vitals:   09/16/12 0315 09/16/12 0600 09/16/12 1003 09/16/12 1429  BP: 128/84 138/75 155/82 137/73  Pulse:  70 77 69  Temp:  97.7 F (36.5 C) 97.8 F (36.6 C) 97.9 F (36.6 C)  TempSrc:   Oral Oral  Resp:  18 18 18   Height:      Weight:      SpO2:  97% 98% 97%    Intake/Output Summary (Last 24 hours) at 09/16/12 1434 Last data filed at 09/15/12 1700  Gross per 24 hour  Intake    360 ml  Output      0 ml  Net    360 ml   Filed Weights   09/14/12 2037  Weight: 68.13 kg (150 lb 3.2 oz)    Exam:   General:  A&o x3, Middle aged female in NAD  Cardiovascular: RRR nl S1S2  Respiratory: clear  Abdomen:soft, +BS, NT/ND  Neuro: R leg weakness, flaccid distally  Data Reviewed: Basic Metabolic Panel:  Lab 09/14/12 8119  NA 141  K 3.6  CL 105  CO2 22  GLUCOSE 108*  BUN 14  CREATININE 0.70  CALCIUM 9.1  MG --  PHOS --   Liver Function Tests: No results found for this basename: AST:5,ALT:5,ALKPHOS:5,BILITOT:5,PROT:5,ALBUMIN:5 in the last 168 hours No results found for this basename: LIPASE:5,AMYLASE:5 in the last 168 hours No results found for this basename: AMMONIA:5 in the last 168 hours CBC:  Lab  09/14/12 1745  WBC 10.1  NEUTROABS 6.6  HGB 13.5  HCT 41.1  MCV 88.8  PLT 249   Cardiac Enzymes: No results found for this basename: CKTOTAL:5,CKMB:5,CKMBINDEX:5,TROPONINI:5 in the last 168 hours BNP (last 3 results) No results found for this basename: PROBNP:3 in the last 8760 hours CBG:  Lab 09/16/12 0018  GLUCAP 102*    No results found for this or any previous visit (from the past 240 hour(s)).   Studies: Ct Head Wo Contrast  09/14/2012  *RADIOLOGY REPORT*  Clinical Data: Weakness from waist down began 5 days ago, now with weakness remaining in right leg, elevated blood pressure, history hypertension, stroke, Crohn's disease, GERD  CT HEAD WITHOUT CONTRAST  Technique:  Contiguous axial images were obtained from the base of the skull through the vertex without contrast.  Comparison: 01/10/2012  Findings: Normal ventricular morphology. No midline shift or mass effect. Old appearing left parietal periventricular white matter infarcts though new since previous exam. Old lacunar infarcts anterior right basal ganglia. No intracranial hemorrhage, mass lesion or evidence of acute infarction. No extra-axial fluid collections. Bones and sinuses unremarkable.  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 No acute intracranial abnormalities.   Original Report Authenticated By: Ulyses Southward, M.D.    Mr Brain Wo Contrast  09/15/2012  *RADIOLOGY REPORT*  Clinical Data: Right leg weakness.  Previous stroke.  MRI HEAD WITHOUT CONTRAST  Technique:  Multiplanar, multiecho pulse sequences of the brain and surrounding structures were obtained according to standard protocol without intravenous contrast.  Comparison: Head CT 11/17.  MRI 01/10/2012.  Findings: There is acute infarction affecting the left basal ganglia, there is a small region of acute infarction affecting the left basal ganglia and posterior limb internal capsule.  No evidence of swelling, mass  effect or hemorrhage.  The brainstem and cerebellum are normal.  There are old lacunar infarctions affecting the thalami, basal ganglia and hemispheric deep white matter.  No cortical or large vessel territory infarction.  No mass lesion, hemorrhage, hydrocephalus or extra-axial collection.  No pituitary mass.  No inflammatory sinus disease.  No skull or skull base lesion.  IMPRESSION:  Small region of acute infarction affecting the left basal ganglia and posterior limb internal capsule.  No swelling, mass effect or hemorrhage.  Old small vessel infarctions as outlined above.   Original Report Authenticated By: Paulina Fusi, M.D.    Dg Lumbar Spine 2-3vclearing  09/15/2012  *RADIOLOGY REPORT*  Clinical Data: Weakness in the right leg, no injury, history of stroke previously  LIMITED LUMBAR SPINE FOR TRAUMA CLEARING - 2-3 VIEW  Comparison: None.  Findings: The lumbar vertebrae are in normal alignment.  Only L5-S1 disc space is slightly narrowed.  There does appear to be degenerative change involving facet joints particularly of L4-5 and L5-S1.  The SI joints appear normally corticated.  Surgical clips overlie the right abdomen.  IMPRESSION:  1.  Normal alignment. 2.  Slightly diminished disc space at L5-S1. 3.  Probable degenerative change involving the facet joints of L4-5 and L5-S1.   Original Report Authenticated By: Dwyane Dee, M.D.     Scheduled Meds:    . amLODipine  10 mg Oral Daily  . aspirin EC  81 mg Oral Daily  . clopidogrel  75 mg Oral Q breakfast  . irbesartan  75 mg Oral Daily  . simvastatin  20 mg Oral q1800  . sodium chloride  3 mL Intravenous Q12H  . sodium chloride  3 mL Intravenous Q12H   Continuous Infusions:   Principal Problem:  *Weakness of right leg Active Problems:  Transient ischemic attack (TIA), and cerebral infarction without residual deficits  Cerebral infarct  HTN (hypertension)    Time spent:    Kela Millin  [facility] Pager  [phone]. If 8PM-8AM, please contact night-coverage at [email or web address], password [facility] 09/16/2012, 2:34 PM  LOS: 2 days

## 2012-09-16 NOTE — Progress Notes (Addendum)
Stroke Team Progress Note  HISTORY HPI: Shari Taylor is a 56 y.o. female who presented with right leg weakness that started Tuesday morning 09/09/12. She states the weakness was present on awakening, and she felt she might have slept on it funny. She subsequently did not have improvement and therefore felt like she might have had a stroke. She states that she felt that she might get better and therefore did not seek treatment. She has had no improvement and therefore has sought treatment at this time. She denies back pain, leg pain, numbness, weakness of her arm or face. She was admitted to [facility] on 09/14/12 by Dr. Amada Jupiter. The patient was not a TPA candidate secondary to late presentation.Marland Kitchen She was admitted to the neuro ICU for further evaluation and treatment.  SUBJECTIVE No one is at the bedside. No new events. Right leg weakness. BP has been labile as outpatient.  OBJECTIVE Most recent Vital Signs: Filed Vitals:   09/16/12 0200 09/16/12 0315 09/16/12 0600 09/16/12 1003  BP: 168/77 128/84 138/75 155/82  Pulse: 77  70 77  Temp: 97.9 F (36.6 C)  97.7 F (36.5 C) 97.8 F (36.6 C)  TempSrc:    Oral  Resp: 18  18 18   Height:      Weight:      SpO2: 96%  97% 98%   CBG (last 3)   Basename 09/16/12 0018  GLUCAP 102*    IV Fluid Intake:     MEDICATIONS    . amLODipine  10 mg Oral Daily  . aspirin EC  81 mg Oral Daily  . clopidogrel  75 mg Oral Q breakfast  . [COMPLETED] influenza  inactive virus vaccine  0.5 mL Intramuscular Tomorrow-1000  . irbesartan  75 mg Oral Daily  . simvastatin  20 mg Oral q1800  . sodium chloride  3 mL Intravenous Q12H  . sodium chloride  3 mL Intravenous Q12H   PRN:  sodium chloride, sodium chloride  Diet:  Cardiac with thin liquids Activity:  Up with assistance. DVT Prophylaxis:  SCDs  CLINICALLY SIGNIFICANT STUDIES Basic Metabolic Panel:  Lab 09/14/12 8295  NA 141  K 3.6  CL 105  CO2 22  GLUCOSE 108*  BUN 14    CREATININE 0.70  CALCIUM 9.1  MG --  PHOS --   Liver Function Tests: No results found for this basename: AST:2,ALT:2,ALKPHOS:2,BILITOT:2,PROT:2,ALBUMIN:2 in the last 168 hours CBC:  Lab 09/14/12 1745  WBC 10.1  NEUTROABS 6.6  HGB 13.5  HCT 41.1  MCV 88.8  PLT 249   Coagulation: No results found for this basename: LABPROT:4,INR:4 in the last 168 hours Cardiac Enzymes: No results found for this basename: CKTOTAL:3,CKMB:3,CKMBINDEX:3,TROPONINI:3 in the last 168 hours Urinalysis: No results found for this basename: COLORURINE:2,APPERANCEUR:2,LABSPEC:2,PHURINE:2,GLUCOSEU:2,HGBUR:2,BILIRUBINUR:2,KETONESUR:2,PROTEINUR:2,UROBILINOGEN:2,NITRITE:2,LEUKOCYTESUR:2 in the last 168 hours Lipid Panel    Component Value Date/Time   CHOL 213* 09/16/2012 0609   TRIG 209* 09/16/2012 0609   HDL 38* 09/16/2012 0609   CHOLHDL 5.6 09/16/2012 0609   VLDL 42* 09/16/2012 0609   LDLCALC 133* 09/16/2012 0609   HgbA1C  Lab Results  Component Value Date   HGBA1C 6.2* 01/10/2012    Urine Drug Screen:   No results found for this basename: labopia, cocainscrnur, labbenz, amphetmu, thcu, labbarb    Alcohol Level: No results found for this basename: ETH:2 in the last 168 hours      Dg Lumbar Spine 2-3vclearing  09/15/2012  *RADIOLOGY REPORT*  IMPRESSION:  1.  Normal alignment. 2.  Slightly  diminished disc space at L5-S1. 3.  Probable degenerative change involving the facet joints of L4-5 and L5-S1.    CT of the brain Old left periventricular white matter and right basal ganglia lacunar infarcts. No acute intracranial abnormalities.     MRI of the brain  Mr Brain Wo Contrast Small region of acute infarction affecting the left basal ganglia and posterior limb internal capsule.  No swelling, mass effect or hemorrhage.  Old small vessel infarctions as outlined.  2D Echocardiogram  Normal LV size and systolic function with mild LV hypertrophy. EF 60-65%. Normal RV size and systolic function. No  significant valvular abnormalities.  Carotid Doppler    Therapy Recommendations PT & OT to evaluate and treat  Physical Exam   GENERAL EXAM: Patient is in no distress  CARDIOVASCULAR: Regular rate and rhythm, no murmurs, no carotid bruits  NEUROLOGIC: MENTAL STATUS: awake, alert, language fluent, comprehension intact, naming intact CRANIAL NERVE: pupils equal and reactive to light, visual fields full to confrontation, extraocular muscles intact, no nystagmus, facial sensation and strength symmetric, uvula midline, shoulder shrug symmetric, tongue midline. MOTOR: normal bulk and tone, RUE DRIFT, RLE 2-3/5. LEFT SIDE FULL STRENGTH.  SENSORY: normal and symmetric to light touch, pinprick, temperature COORDINATION: finger-nose-finger, fine finger movements, heel-shin normal REFLEXES: deep tendon reflexes present and symmetric GAIT/STATION: SITTING BY WINDOW.  ASSESSMENT Shari Taylor is a 56 y.o. female presenting with right leg weakness. No TPA secondary to late presentation. Imaging confirms a left basal ganglia and posterior limb internal capsule acute infarct. Infarct felt to be embolic secondary to unknown source.. Admitted in March 2013 for a brainstem CVA. Not seen on MRI. Work up underway. On ASA 81 mg daily. prior to admission. Now on aspirin 81 mg and Plavix 75mg  daily for secondary stroke prevention. Patient with resultant CVA.   Hyperlipidemia - Zocor  Htn - multiple anti htn meds.  Risk factor modification.  Hospital day # 2  TREATMENT/PLAN  Continue clopidogrel 75 mg orally every day for secondary stroke prevention.  Order Carotid Dopplers  D/C aspirin.  TEE ordered to R/O embolic source.  Hypercoagulable workup in March negative.   Delton See PA-C [facility] Pager [phone] 09/16/2012, 4:05 PM 09/16/2012 11:19 AM  [facility] - Stroke Team Joycelyn Schmid, MD 09/16/12  Please refer to [email or web address] for on-call Stroke  MD

## 2012-09-16 NOTE — Evaluation (Signed)
Physical Therapy Evaluation Patient Details Name: TYYANNA GASKINS MRN: [id number] DOB: 04-12-1956 Today's Date: 09/16/2012 Time: 4098-1191 PT Time Calculation (min): 32 min  PT Assessment / Plan / Recommendation Clinical Impression  Pt is a 56 y.o. female who presents with deficits in functional mobility seconadry to weakness and poor balance and stability.  Pt will benefit from continued skilled PT to address deficits and maximize independence for d/c home. Rec outpatient PT upon discharge.    PT Assessment  Patient needs continued PT services    Follow Up Recommendations  Outpatient PT    Does the patient have the potential to tolerate intense rehabilitation      Barriers to Discharge Inaccessible home environment;Decreased caregiver support      Equipment Recommendations  Rolling Walker with 5" wheels    Recommendations for Other Services     Frequency Min 4X/week    Precautions / Restrictions Precautions Precautions: Fall   Pertinent Vitals/Pain 0/10     Mobility  Transfers Sit to Stand: 3: Mod assist Stand to Sit: 3: Mod assist Ambulation/Gait Ambulation/Gait Assistance: 4: Min assist Ambulation Distance (Feet): 40 Feet Assistive device: 1 person hand held assist Ambulation/Gait Assistance Details: Pt unsteady and drags RLE in extension position Gait Pattern: Step-to pattern;Decreased stride length;Shuffle Gait velocity: decreased General Gait Details: Pt able to flex RLE into steppage gait with cues but at times is unable to flex knee on her own; drags RLE in extension with foot flat on floor Stairs: No       Exercises General Exercises - Lower Extremity Ankle Circles/Pumps: 5 reps;AROM Straight Leg Raises: Both;5 reps Hip Flexion/Marching: 5 reps;Right   PT Diagnosis: Difficulty walking;Abnormality of gait;Generalized weakness  PT Problem List: Decreased strength;Decreased range of motion;Decreased activity tolerance;Decreased balance;Decreased  mobility;Decreased safety awareness PT Treatment Interventions: DME instruction;Gait training;Stair training;Functional mobility training;Therapeutic activities;Therapeutic exercise;Patient/family education   PT Goals Acute Rehab PT Goals PT Goal Formulation: With patient Time For Goal Achievement: 09/21/12 Potential to Achieve Goals: Good Pt will go Sit to Stand: with modified independence PT Goal: Sit to Stand - Progress: Goal set today Pt will go Stand to Sit: with modified independence PT Goal: Stand to Sit - Progress: Goal set today Pt will Ambulate: >150 feet;with modified independence PT Goal: Ambulate - Progress: Goal set today Pt will Go Up / Down Stairs: 6-9 stairs;with modified independence PT Goal: Up/Down Stairs - Progress: Goal set today  Visit Information  Last PT Received On: 09/16/12 Assistance Needed: +1    Subjective Data  Subjective: I feel like my leg is heavy Patient Stated Goal: to go home and be able to walk around   Prior Functioning  Home Living Lives With: Significant other Available Help at Discharge: Family Type of Home: House Home Layout: Two level;Able to live on main level with bedroom/bathroom Alternate Level Stairs-Number of Steps: 2 Alternate Level Stairs-Rails: None Bathroom Shower/Tub: Tub/shower unit;Walk-in shower;Door Dentist: None Prior Function Level of Independence: Independent Able to Take Stairs?: Yes Driving: Yes Vocation: Full time employment Communication Communication: No difficulties Dominant Hand: Right    Cognition  Overall Cognitive Status: Appears within functional limits for tasks assessed/performed Arousal/Alertness: Awake/alert Orientation Level: Appears intact for tasks assessed Behavior During Session: [facility] for tasks performed    Extremity/Trunk Assessment Right Upper Extremity Assessment RUE ROM/Strength/Tone: WFL for tasks assessed RUE Sensation: WFL - Light  Touch RUE Coordination: WFL - gross/fine motor Left Upper Extremity Assessment LUE ROM/Strength/Tone: WFL for tasks assessed LUE Sensation:  WFL - Light Touch LUE Coordination: WFL - gross/fine motor Right Lower Extremity Assessment RLE ROM/Strength/Tone: Deficits;Unable to fully assess Left Lower Extremity Assessment LLE ROM/Strength/Tone: [facility] for tasks assessed Trunk Assessment Trunk Assessment: Normal   Balance    End of Session PT - End of Session Equipment Utilized During Treatment: Gait belt Activity Tolerance: Patient tolerated treatment well Patient left: in chair;with call bell/phone within reach Nurse Communication: Mobility status  GP Functional Assessment Tool Used: Clinical Judgement (Clinical Judgement) Functional Limitation: Mobility: Walking and moving around Mobility: Walking and Moving Around Current Status (Z6109): At least 20 percent but less than 40 percent impaired, limited or restricted Mobility: Walking and Moving Around Goal Status [phone]): At least 1 percent but less than 20 percent impaired, limited or restricted   Fabio Asa 09/16/2012, 3:57 PM Charlotte Crumb, PT DPT  [phone]

## 2012-09-17 ENCOUNTER — Encounter (HOSPITAL_COMMUNITY): Payer: Self-pay | Admitting: *Deleted

## 2012-09-17 ENCOUNTER — Encounter (HOSPITAL_COMMUNITY)
Admission: EM | Disposition: A | Discharge status: Home / Self Care | Payer: Self-pay | Source: Home / Self Care | Attending: Emergency Medicine

## 2012-09-17 DIAGNOSIS — E785 Hyperlipidemia, unspecified: Secondary | ICD-10-CM

## 2012-09-17 DIAGNOSIS — I6789 Other cerebrovascular disease: Secondary | ICD-10-CM

## 2012-09-17 HISTORY — PX: TEE WITHOUT CARDIOVERSION: SHX5443

## 2012-09-17 SURGERY — ECHOCARDIOGRAM, TRANSESOPHAGEAL
Anesthesia: Moderate Sedation

## 2012-09-17 MED ORDER — MIDAZOLAM HCL 10 MG/2ML IJ SOLN
INTRAMUSCULAR | Status: DC | PRN
  Administered 2012-09-17: 2 mg via INTRAVENOUS
  Administered 2012-09-17: 1 mg via INTRAVENOUS
  Administered 2012-09-17 (×2): 2 mg via INTRAVENOUS

## 2012-09-17 MED ORDER — MEPERIDINE HCL 25 MG/ML IJ SOLN
INTRAMUSCULAR | Status: DC | PRN
  Administered 2012-09-17 (×2): 25 mg via INTRAVENOUS

## 2012-09-17 MED ORDER — SODIUM CHLORIDE 0.9 % IV SOLN: INTRAVENOUS | Status: DC

## 2012-09-17 MED ORDER — CLOPIDOGREL BISULFATE 75 MG PO TABS: 75.0000 mg | ORAL_TABLET | Freq: Every day | ORAL | Status: DC

## 2012-09-17 MED ORDER — MIDAZOLAM HCL 5 MG/ML IJ SOLN
INTRAMUSCULAR | Status: AC
  Filled 2012-09-17: qty 2

## 2012-09-17 MED ORDER — ATORVASTATIN CALCIUM 40 MG PO TABS: 40.0000 mg | ORAL_TABLET | Freq: Every day | ORAL | Status: DC

## 2012-09-17 MED ORDER — ASPIRIN-DIPYRIDAMOLE ER 25-200 MG PO CP12: 1.0000 | ORAL_CAPSULE | Freq: Two times a day (BID) | ORAL | Status: DC

## 2012-09-17 MED ORDER — BUTAMBEN-TETRACAINE-BENZOCAINE 2-2-14 % EX AERO
INHALATION_SPRAY | CUTANEOUS | Status: DC | PRN
  Administered 2012-09-17: 2 via TOPICAL

## 2012-09-17 MED ORDER — ATORVASTATIN CALCIUM 20 MG PO TABS
20.0000 mg | ORAL_TABLET | Freq: Every day | ORAL | Status: DC
  Filled 2012-09-17: qty 1

## 2012-09-17 MED ORDER — ATORVASTATIN CALCIUM 40 MG PO TABS
40.0000 mg | ORAL_TABLET | Freq: Every day | ORAL | Status: DC
  Administered 2012-09-17: 40 mg via ORAL
  Filled 2012-09-17: qty 1

## 2012-09-17 MED ORDER — MEPERIDINE HCL 100 MG/ML IJ SOLN
INTRAMUSCULAR | Status: AC
  Filled 2012-09-17: qty 1

## 2012-09-17 MED ORDER — ONDANSETRON HCL 4 MG/2ML IJ SOLN
INTRAMUSCULAR | Status: AC
  Filled 2012-09-17: qty 2

## 2012-09-17 MED ORDER — SIMVASTATIN 40 MG PO TABS: 40.0000 mg | ORAL_TABLET | Freq: Every day | ORAL | Status: DC

## 2012-09-17 MED ORDER — PANTOPRAZOLE SODIUM 40 MG PO TBEC: 40.0000 mg | DELAYED_RELEASE_TABLET | Freq: Every day | ORAL | Status: DC

## 2012-09-17 NOTE — H&P (View-Only) (Signed)
[facility] PROGRESS NOTE  Tally Mattox Folson ZOX:[id number] DOB: 05-23-1956 DOA: 09/14/2012 PCP: Bradd Burner, PA  Assessment/Plan:  #1 acute small left basal ganglia and posterior limb internal capsule stroke Questionable etiology. Likely him embolic in nature with unknown source. Patient with some improvement in the right lower extremity weakness. LDL of 133. Goal LDL should be less than 70. Increase lipitor to 40 mg daily. Patient for TEE today to rule out source of emboli. Hemoglobin A1c is 6. Continue Plavix. PT /OT. Carotid Doppler is pending. Hypercoagulable panel in March was negative. Neurology following and appreciate input and recommendations.  #2 hyperlipidemia LDL is 133. Goal LDL should be less than 70. Increase Zocor to 40 mg daily. Followup with PCP as outpatient.  #3 hypertension Stable. Continue Norvasc, Avapro.  #4 GERD PPI  #5 prophylaxis PPI for GI prophylaxis, SCDs for DVT prophylaxis.  Code Status: Full Family Communication: Updated patient at bedside. Disposition Plan: Home when medically stable   Consultants:  Neurology : Dr Amada Jupiter 09/14/12  Procedures:  CT head 09/14/2012  Chest x-ray 09/15/2012  MRI brain 09/15/2012  TEE pending 11/ 20/13  Carotid Dopplers pending 09/17/2012  Antibiotics:  None  HPI/Subjective: Patient wants to go home and did not want to wait for TEE at 3pm. Agreeable to stay for TEE , now that it is rescheduled for 11am. Patient states RLE weakness improving.  Objective: Filed Vitals:   09/16/12 1429 09/16/12 2232 09/17/12 0300 09/17/12 0600  BP: 137/73 137/70 125/65 119/82  Pulse: 69 73 63 69  Temp: 97.9 F (36.6 C) 98.5 F (36.9 C) 97.6 F (36.4 C) 98.1 F (36.7 C)  TempSrc: Oral Oral Oral Oral  Resp: 18 16 18 18   Height:      Weight:      SpO2: 97% 97% 95% 98%   No intake or output data in the 24 hours ending 09/17/12 0903 Filed Weights   09/14/12 2037  Weight: 68.13 kg (150 lb 3.2  oz)    Exam:   General:  NAD  Cardiovascular: RRR  Respiratory: CTAB  Abdomen: Soft/NT/ND/+BS  Data Reviewed: Basic Metabolic Panel:  Lab 09/14/12 8119  NA 141  K 3.6  CL 105  CO2 22  GLUCOSE 108*  BUN 14  CREATININE 0.70  CALCIUM 9.1  MG --  PHOS --   Liver Function Tests: No results found for this basename: AST:5,ALT:5,ALKPHOS:5,BILITOT:5,PROT:5,ALBUMIN:5 in the last 168 hours No results found for this basename: LIPASE:5,AMYLASE:5 in the last 168 hours No results found for this basename: AMMONIA:5 in the last 168 hours CBC:  Lab 09/14/12 1745  WBC 10.1  NEUTROABS 6.6  HGB 13.5  HCT 41.1  MCV 88.8  PLT 249   Cardiac Enzymes: No results found for this basename: CKTOTAL:5,CKMB:5,CKMBINDEX:5,TROPONINI:5 in the last 168 hours BNP (last 3 results) No results found for this basename: PROBNP:3 in the last 8760 hours CBG:  Lab 09/16/12 0018  GLUCAP 102*    No results found for this or any previous visit (from the past 240 hour(s)).   Studies: No results found.  Scheduled Meds:   . amLODipine  10 mg Oral Daily  . atorvastatin  20 mg Oral q1800  . clopidogrel  75 mg Oral Q breakfast  . irbesartan  75 mg Oral Daily  . sodium chloride  3 mL Intravenous Q12H  . sodium chloride  3 mL Intravenous Q12H  . [DISCONTINUED] aspirin EC  81 mg Oral Daily  . [DISCONTINUED] simvastatin  20 mg Oral q1800  . [  DISCONTINUED] simvastatin  40 mg Oral q1800   Continuous Infusions:   . sodium chloride      Principal Problem:  *Cerebral infarct Active Problems:  Transient ischemic attack (TIA), and cerebral infarction without residual deficits  HTN (hypertension)  GERD (gastroesophageal reflux disease)  Hyperlipidemia LDL goal < 100  Weakness of right leg    Time spent: > 35 mins    [facility]  [facility] Pager [phone]. If 8PM-8AM, please contact night-coverage at [email or web address], password [email or web address] 09/17/2012, 9:03 AM  LOS: 3 days

## 2012-09-17 NOTE — ED Provider Notes (Signed)
  I performed a history and physical examination of Shari Taylor and discussed her management with Dr. Ambrose Mantle.  I agree with the history, physical, assessment, and plan of care, with the following exceptions: None  On my exam the patient was in no distress.  Given the persistency of her right leg weakness she was admitted for further evaluation and management.  Elyse Jarvis, MD 09/17/12 [phone]

## 2012-09-17 NOTE — Progress Notes (Signed)
Physical Therapy Treatment Patient Details Name: Shari Taylor MRN: [id number] DOB: [birth year] Today's Date: 09/17/2012 Time: 7425-9563 PT Time Calculation (min): 27 min  PT Assessment / Plan / Recommendation Comments on Treatment Session  Pt. presents to be motivated and moving well. Pt. able to clear her Rt. LE with ambulation today, though gt. pattern was not typical. Pt. used single point cane during ambulation and may try RW in next session to attempt to gain increased steadiness and greater independence with gt. Pt. educated to pace herself with activities and to take rest breaks as needed.      Follow Up Recommendations  Outpatient PT     Does the patient have the potential to tolerate intense rehabilitation     Barriers to Discharge        Equipment Recommendations  None recommended by OT    Recommendations for Other Services    Frequency Min 4X/week   Plan      Precautions / Restrictions Precautions Precautions: Fall Restrictions Weight Bearing Restrictions: No   Pertinent Vitals/Pain Patient had no reports of pain during this session.    Mobility  Bed Mobility Bed Mobility: Not assessed Transfers Transfers: Sit to Stand;Stand to Sit Sit to Stand: 4: Min guard;With upper extremity assist;With armrests;From chair/3-in-1 Stand to Sit: 4: Min guard;With upper extremity assist;With armrests;To chair/3-in-1 Details for Transfer Assistance: Pt. min guard for safety and steadiness with transfers. VC for proper hand placement when stepping to the single point cane, Ambulation/Gait Ambulation/Gait Assistance: 4: Min guard;4: Min Environmental consultant (Feet): 100 Feet Assistive device: Straight cane Ambulation/Gait Assistance Details: Pt. ambulated with a single point cane to progress +1HHA used previously. Pt. min guard for safety and min (A) as ambulation continued. Pt. with 2 rest breaks, one standing, one sitting. Pt. demonstrated being able to clear her foot  and flex her knee some with ambulation, though gt. did not present as typical. Pt. stated as though she did not need the cane with amublation and when she attempted not using it she had strong lean to the Lt. and increased sway. Pt. encouraged to be using cane when ambulating with (A) at this time. Gait Pattern: Step-to pattern;Decreased stride length Gait velocity: decreased General Gait Details: Pt. presents with being able to flex her knee Rt. knee with ambulation, though not with typical gt. pattern as she almost marched when stepping with the Rt LE flexing the knee to the front and keeping the foot under her trunk versus behind her. No dragging of Rt. LE was observed in todays session. Stairs: Yes Stairs Assistance: 4: Min assist Stairs Assistance Details (indicate cue type and reason): Pt. min (A) on stairs using single point cane and +1HHA to mimic no rails at her home. Pt. had difficulty clearing the lip of step with Rt. LE during ascent and demonstrated more control upon descent with Rt. LE stating it was easier going down the stairs than going up.  Stair Management Technique: With cane;No rails (+1HHA) Number of Stairs: 3  Wheelchair Mobility Wheelchair Mobility: No    Exercises General Exercises - Lower Extremity Ankle Circles/Pumps: AROM;5 reps;Both;Seated Quad Sets: AROM;Right;Other reps (comment);Seated (3) Long Arc Quad: AROM;Both;5 reps;Seated Hip Flexion/Marching: AROM;Both;5 reps;Seated     PT Goals Acute Rehab PT Goals PT Goal Formulation: With patient Time For Goal Achievement: 09/21/12 Potential to Achieve Goals: Good Pt will go Sit to Stand: with modified independence PT Goal: Sit to Stand - Progress: Progressing toward goal Pt will go Stand to  Sit: with modified independence PT Goal: Stand to Sit - Progress: Progressing toward goal Pt will Ambulate: >150 feet;with modified independence PT Goal: Ambulate - Progress: Progressing toward goal Pt will Go Up / Down  Stairs: 6-9 stairs;with modified independence PT Goal: Up/Down Stairs - Progress: Progressing toward goal  Visit Information  Last PT Received On: 09/17/12 Assistance Needed: +1    Subjective Data  Subjective: "I feel like I can move my leg a littlebetter today" Patient Stated Goal: to go home and be able to walk around   Cognition  Overall Cognitive Status: Appears within functional limits for tasks assessed/performed Arousal/Alertness: Awake/alert Orientation Level: Appears intact for tasks assessed Behavior During Session: [facility] for tasks performed    Balance     End of Session PT - End of Session Equipment Utilized During Treatment: Gait belt Activity Tolerance: Patient tolerated treatment well Patient left: in chair;with call bell/phone within reach Nurse Communication: Mobility status    Mertie Clause, SPTA 09/17/2012, 10:29 AM   Verdell Face, PTA [phone] 09/18/2012

## 2012-09-17 NOTE — Discharge Summary (Signed)
Physician Discharge Summary  Shari Taylor UEA:[id number] DOB: Sep 17, 1956 DOA: 09/14/2012  PCP: Bradd Burner, PA  Admit date: 09/14/2012 Discharge date: 09/17/2012  Time spent: 60 minutes  Recommendations for Outpatient Follow-up:  1. Patient is to followup with PCP one week post discharge. 2. Patient is to followup with Dr. Marlis Edelson office in one month for TCD/bubble study 3. Patient is to followup with Dr. Pearlean Brownie in 2 months.  Discharge Diagnoses:  Principal Problem:  *Cerebral infarct Active Problems:  Transient ischemic attack (TIA), and cerebral infarction without residual deficits  HTN (hypertension)  GERD (gastroesophageal reflux disease)  Hyperlipidemia LDL goal < 100  Weakness of right leg   Discharge Condition: Stable and improved  Diet recommendation: Heart healthy  Filed Weights   09/14/12 2037  Weight: 68.13 kg (150 lb 3.2 oz)    History of present illness:  56 yo female h/o cva 3 months ago s/p tpa at that time with mild residual scotaoma eyesight comes in with 5 days of persistetn rle weakness/numbness no back pain no hip pain. No other focal neuro deficitis. Has been walking with a limp. No fevers. No n/v/d. No recent illnesses in the last month. No pain buttock. Neuro recommended mri which cannot be done tonight. Is taking asa and plavix.      Hospital Course:  #1 acute small left basal ganglia and posterior limb internal capsule stroke  Patient had presented with some right lower extremity weakness and was admitted to the telemetry floor for stroke workup. CT head which was done showed old lacunar and faxed with no acute intracranial abnormalities.  MRI of the head was done which showed a small region of acute infarction affecting the left basal ganglia and posterior limb of the internal capsule. Patient was initially on aspirin prior to admission and was subsequently changed to Plavix. Carotid Dopplers were done with preliminary result with no  significant ICA stenosis. A transesophageal echocardiogram was done on 09/17/2012 which showed an EF of 55-60%. No evidence of thrombus on the atrial cavity or appendage, no evidence of vegetations in the aortic or mitral valve noted. No defect or PFO was identified. Patient improved clinically during the hospitalization. Fasting lipid panel was done which showed an LDL of 133. Goal LDL should be less than 70. Patient's Zocor was increased to 40 mg a daily.  Patient had a hypercoagulable panel in March which was done which was negative. Patient was seen by neurology and followed by neurology throughout the hospitalization. Patient due to concern of embolic nature TEE was ordered with results as stated above which was negative. It was recommended per neurology 2 continue Plavix for one week then add Aggrenox 1 tablet daily with Plavix for another week and then discontinue Plavix and place patient on Aggrenox 1 tablet twice daily. Patient is to followup with Dr. Pearlean Brownie of [facility] associates in one month for TCD on bubble study and to followup with him in 2 months. Patient will be discharged home in stable and improved condition with outpatient physical therapy.  #2 hyperlipidemia  during the workup for problem #1 fasting lipid panel was obtained which showed LDL of 133. Goal LDL should be less than 70.  patient's Zocor was increased to 40 mg daily. Followup with PCP as outpatient.  #3 hypertension  Stable. Continue Norvasc, Avapro.  The rest of patient's chronic medical issues remained stable throughout the hospitalization the patient was discharged in stable and improved condition      Procedures: CT head  09/14/2012  Chest x-ray 09/15/2012  MRI brain 09/15/2012  TEE  11/ 20/13  Carotid Dopplers 09/17/2012     Consultations: Neurology : Dr Amada Jupiter 09/14/12   Discharge Exam: Filed Vitals:   09/17/12 1157 09/17/12 1200 09/17/12 1210 09/17/12 1338  BP:  122/72 142/97 111/57    Pulse:    70  Temp: 97.8 F (36.6 C)   97.6 F (36.4 C)  TempSrc:    Oral  Resp:  21 20 18   Height:      Weight:      SpO2:  94% 94% 98%    General: NAD Cardiovascular: RRR Respiratory: CTAB  Discharge Instructions      Discharge Orders    Future Orders Please Complete By Expires   Ambulatory referral to Physical Therapy      Diet - low sodium heart healthy      Increase activity slowly      Discharge instructions      Comments:   Follow up with Bradd Burner, PA in 1 week. Follow up with Dr Pearlean Brownie office in 1 month for TCD bubble study. Follow up with Dr Pearlean Brownie in 2 months.       Medication List     As of 09/17/2012  6:39 PM    STOP taking these medications         aspirin EC 81 MG tablet      simvastatin 20 MG tablet   Commonly known as: ZOCOR      TAKE these medications         amLODipine 10 MG tablet   Commonly known as: NORVASC   Take 10 mg by mouth daily.      atorvastatin 40 MG tablet   Commonly known as: LIPITOR   Take 1 tablet (40 mg total) by mouth daily at 6 PM.      clopidogrel 75 MG tablet   Commonly known as: PLAVIX   Take 1 tablet (75 mg total) by mouth daily with breakfast. Take 1 tablet daily x 2 weeks, then stop.      dipyridamole-aspirin 200-25 MG per 12 hr capsule   Commonly known as: AGGRENOX   Take 1 capsule by mouth 2 (two) times daily. Take 1 tablet daily x 1 week starting on 09/24/12 for 1 week, then Take 1 tablet 2 times daily starting on 10/01/12   Start taking on: 09/24/2012      GAVISCON PO   Take 1 tablet by mouth daily as needed. For indigestion/reflux      hydrocortisone-pramoxine rectal foam   Commonly known as: PROCTOFOAM-HC   Place 1 applicator rectally 2 (two) times daily as needed. For pain      ranitidine 150 MG capsule   Commonly known as: ZANTAC   Take 150 mg by mouth 2 (two) times daily as needed. For reflux      valsartan 80 MG tablet   Commonly known as: DIOVAN   Take 80-160 mg by mouth 2  (two) times daily. Take 2 tablets in AM and 1 tablet in PM        Follow-up Information    Follow up with SETHI,PRAMODKUMAR P, MD. Call in 2 days. (Schedule TCD bubble study with emboli monitoring. Also to see him in 2 months.)    Contact information:   [address] [facility] [place] [address] [phone]       Follow up with Bradd Burner, PA. Schedule an appointment as soon as possible for a  visit in 1 week.   Contact information:   [address] [place] [address] [phone]       Follow up with Gates Rigg, MD. Schedule an appointment as soon as possible for a visit in 2 months.   Contact information:   [address], [address] [facility] [place] [address] [phone]           The results of significant diagnostics from this hospitalization (including imaging, microbiology, ancillary and laboratory) are listed below for reference.    Significant Diagnostic Studies: Ct Head Wo Contrast  09/14/2012  *RADIOLOGY REPORT*  Clinical Data: Weakness from waist down began 5 days ago, now with weakness remaining in right leg, elevated blood pressure, history hypertension, stroke, Crohn's disease, GERD  CT HEAD WITHOUT CONTRAST  Technique:  Contiguous axial images were obtained from the base of the skull through the vertex without contrast.  Comparison: 01/10/2012  Findings: Normal ventricular morphology. No midline shift or mass effect. Old appearing left parietal periventricular white matter infarcts though new since previous exam. Old lacunar infarcts anterior right basal ganglia. No intracranial hemorrhage, mass lesion or evidence of acute infarction. No extra-axial fluid collections. Bones and sinuses unremarkable.  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 No acute intracranial abnormalities.   Original Report Authenticated By: Ulyses Southward, M.D.    Mr Brain Wo Contrast  09/15/2012  *RADIOLOGY REPORT*  Clinical Data: Right leg weakness.  Previous stroke.  MRI HEAD WITHOUT CONTRAST  Technique:  Multiplanar, multiecho pulse sequences of the brain and surrounding structures were obtained according to standard protocol without intravenous contrast.  Comparison: Head CT 11/17.  MRI 01/10/2012.  Findings: There is acute infarction affecting the left basal ganglia, there is a small region of acute infarction affecting the left basal ganglia and posterior limb internal capsule.  No evidence of swelling, mass effect or hemorrhage.  The brainstem and cerebellum are normal.  There are old lacunar infarctions affecting the thalami, basal ganglia and hemispheric deep white matter.  No cortical or large vessel territory infarction.  No mass lesion, hemorrhage, hydrocephalus or extra-axial collection.  No pituitary mass.  No inflammatory sinus disease.  No skull or skull base lesion.  IMPRESSION:  Small region of acute infarction affecting the left basal ganglia and posterior limb internal capsule.  No swelling, mass effect or hemorrhage.  Old small vessel infarctions as outlined above.   Original Report Authenticated By: Paulina Fusi, M.D.    Dg Lumbar Spine 2-3vclearing  09/15/2012  *RADIOLOGY REPORT*  Clinical Data: Weakness in the right leg, no injury, history of stroke previously  LIMITED LUMBAR SPINE FOR TRAUMA CLEARING - 2-3 VIEW  Comparison: None.  Findings: The lumbar vertebrae are in normal alignment.  Only L5-S1 disc space is slightly narrowed.  There does appear to be degenerative change involving facet joints particularly of L4-5 and L5-S1.  The SI joints appear normally corticated.  Surgical clips overlie the right abdomen.  IMPRESSION:  1.  Normal alignment. 2.  Slightly diminished disc space at L5-S1. 3.  Probable degenerative change involving the facet joints of L4-5 and L5-S1.   Original Report Authenticated By: Dwyane Dee, M.D.      Microbiology: No results found for this or any previous visit (from the past 240 hour(s)).   Labs: Basic Metabolic Panel:  Lab 09/14/12 5784  NA 141  K 3.6  CL 105  CO2 22  GLUCOSE 108*  BUN 14  CREATININE 0.70  CALCIUM 9.1  MG --  PHOS --   Liver Function Tests: No results found for this basename: AST:5,ALT:5,ALKPHOS:5,BILITOT:5,PROT:5,ALBUMIN:5 in the last 168 hours No results found for this basename: LIPASE:5,AMYLASE:5 in the last 168 hours No results found for this basename: AMMONIA:5 in the last 168 hours CBC:  Lab 09/14/12 1745  WBC 10.1  NEUTROABS 6.6  HGB 13.5  HCT 41.1  MCV 88.8  PLT 249   Cardiac Enzymes: No results found for this basename: CKTOTAL:5,CKMB:5,CKMBINDEX:5,TROPONINI:5 in the last 168 hours BNP: BNP (last 3 results) No results found for this basename: PROBNP:3 in the last 8760 hours CBG:  Lab 09/16/12 0018  GLUCAP 102*       Signed:  THOMPSON,DANIEL  [facility] 09/17/2012, 6:39 PM

## 2012-09-17 NOTE — Progress Notes (Signed)
Occupational Therapy Treatment Patient Details Name: Shari Taylor MRN: [id number] DOB: 09-Aug-1956 Today's Date: 09/17/2012 Time: 6213-0865 OT Time Calculation (min): 30 min  OT Assessment / Plan / Recommendation Comments on Treatment Session Pt with concerns about R UE function.  Performs fine motor tasks slightly slower than baseline with some mild deficits in handwriting.  Instructed pt in activities to address.  Pt with much less anxiety following activities once she saw how minimal deficits appear.    Follow Up Recommendations  Outpatient OT    Barriers to Discharge       Equipment Recommendations  Other (comment) (recommended pt sit for showering for safety) Will use chair at home.   Recommendations for Other Services    Frequency Min 2X/week   Plan Discharge plan remains appropriate    Precautions / Restrictions Precautions Precautions: Fall Restrictions Weight Bearing Restrictions: No   Pertinent Vitals/Pain No pain    ADL  Grooming: Teeth care;Supervision/safety Where Assessed - Grooming: Supported standing Upper Body Dressing: Set up Where Assessed - Upper Body Dressing: Unsupported sitting Lower Body Dressing: Minimal assistance Where Assessed - Lower Body Dressing: Unsupported sit to stand Toilet Transfer: Min Pension scheme manager Method: Sit to Barista: Comfort height toilet Toileting - Clothing Manipulation and Hygiene: Supervision/safety Where Assessed - Toileting Clothing Manipulation and Hygiene: Sit to stand from 3-in-1 or toilet Transfers/Ambulation Related to ADLs: min guard assist short distances with cane ADL Comments: Pt with concerns about R hand function.  Used a battery of items and activities to determine function including simulating activities pt would perform in her work environment.  Pt able to perform in hand manipulation, translation movements, and separation of hand sides slightly slowly, Pt noting difficulty with  handwriting but that improved with practice.      OT Diagnosis:    OT Problem List:   OT Treatment Interventions:     OT Goals ADL Goals Pt Will Perform Grooming: with supervision;Standing at sink ADL Goal: Grooming - Progress: Met Pt Will Perform Upper Body Dressing: with supervision;Sit to stand from chair ADL Goal: Upper Body Dressing - Progress: Met Pt Will Perform Lower Body Dressing: with supervision;Sit to stand from chair ADL Goal: Lower Body Dressing - Progress: Progressing toward goals Pt Will Transfer to Toilet: with supervision;Comfort height toilet ADL Goal: Toilet Transfer - Progress: Progressing toward goals Pt Will Perform Toileting - Clothing Manipulation: with supervision;Standing ADL Goal: Toileting - Clothing Manipulation - Progress: Met  Visit Information  Last OT Received On: 09/17/12 Assistance Needed: +1    Subjective Data      Prior Functioning       Cognition  Overall Cognitive Status: Appears within functional limits for tasks assessed/performed Arousal/Alertness: Awake/alert Orientation Level: Appears intact for tasks assessed Behavior During Session: [facility] for tasks performed    Mobility  Shoulder Instructions Bed Mobility Bed Mobility: Not assessed Transfers Sit to Stand: 4: Min guard;With upper extremity assist;With armrests;From chair/3-in-1 Stand to Sit: 4: Min guard;With upper extremity assist;With armrests;To chair/3-in-1 Details for Transfer Assistance: Pt. min guard for safety and steadiness with transfers. VC for proper hand placement when stepping to the single point cane,       Exercises      Balance     End of Session OT - End of Session Activity Tolerance: Patient tolerated treatment well Patient left: Other (comment) (in w/c going to TEE)  GO Functional Assessment Tool Used: clinical judgement Functional Limitation: Self care Self Care Current Status (H8469): At  least 20 percent but less than 40 percent impaired,  limited or restricted Self Care Goal Status (K4401): At least 20 percent but less than 40 percent impaired, limited or restricted   Evern Bio 09/17/2012, 10:24 AM [phone]

## 2012-09-17 NOTE — Interval H&P Note (Signed)
History and Physical Interval Note:  09/17/2012 11:09 AM  Shari Taylor  has presented today for surgery, with the diagnosis of cva  The various methods of treatment have been discussed with the patient and family. After consideration of risks, benefits and other options for treatment, the patient has consented to  Procedure(s) (LRB) with comments: TRANSESOPHAGEAL ECHOCARDIOGRAM (TEE) (N/A) as a surgical intervention .  The patient's history has been reviewed, patient examined, no change in status, stable for surgery.  I have reviewed the patient's chart and labs.  Questions were answered to the patient's satisfaction.     Olga Millers

## 2012-09-17 NOTE — Progress Notes (Signed)
  Echocardiogram Echocardiogram Transesophageal has been performed.  Margreta Journey 09/17/2012, 1:47 PM

## 2012-09-17 NOTE — Progress Notes (Signed)
*  PRELIMINARY RESULTS* Vascular Ultrasound Carotid Duplex (Doppler) has been completed.  Preliminary findings: Bilaterally within normal limits.   EUNICE, Markez Dowland RVT 09/17/2012, 5:27 PM

## 2012-09-17 NOTE — Progress Notes (Signed)
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 Likely him embolic in nature with unknown source. Patient with some improvement in the right lower extremity weakness. LDL of 133. Goal LDL should be less than 70. Increase lipitor to 40 mg daily. Patient for TEE today to rule out source of emboli. Hemoglobin A1c is 6. Continue Plavix. PT /OT. Carotid Doppler is pending. Hypercoagulable panel in March was negative. Neurology following and appreciate input and recommendations.  #2 hyperlipidemia LDL is 133. Goal LDL should be less than 70. Increase Zocor to 40 mg daily. Followup with PCP as outpatient.  #3 hypertension Stable. Continue Norvasc, Avapro.  #4 GERD PPI  #5 prophylaxis PPI for GI prophylaxis, SCDs for DVT prophylaxis.  Code Status: Full Family Communication: Updated patient at bedside. Disposition Plan: Home when medically stable   Consultants:  Neurology : Dr Kirkpatrick 09/14/12  Procedures:  CT head 09/14/2012  Chest x-ray 09/15/2012  MRI brain 09/15/2012  TEE pending 11/ 20/13  Carotid Dopplers pending 09/17/2012  Antibiotics:  None  HPI/Subjective: Patient wants to go home and did not want to wait for TEE at 3pm. Agreeable to stay for TEE , now that it is rescheduled for 11am. Patient states RLE weakness improving.  Objective: Filed Vitals:   09/16/12 1429 09/16/12 2232 09/17/12 0300 09/17/12 0600  BP: 137/73 137/70 125/65 119/82  Pulse: 69 73 63 69  Temp: 97.9 F (36.6 C) 98.5 F (36.9 C) 97.6 F (36.4 C) 98.1 F (36.7 C)  TempSrc: Oral Oral Oral Oral  Resp: 18 16 18 18  Height:      Weight:      SpO2: 97% 97% 95% 98%   No intake or output data in the 24 hours ending 09/17/12 0903 Filed Weights   09/14/12 2037  Weight: 68.13 kg (150 lb 3.2  oz)    Exam:   General:  NAD  Cardiovascular: RRR  Respiratory: CTAB  Abdomen: Soft/NT/ND/+BS  Data Reviewed: Basic Metabolic Panel:  Lab 09/14/12 1745  NA 141  K 3.6  CL 105  CO2 22  GLUCOSE 108*  BUN 14  CREATININE 0.70  CALCIUM 9.1  MG --  PHOS --   Liver Function Tests: No results found for this basename: AST:5,ALT:5,ALKPHOS:5,BILITOT:5,PROT:5,ALBUMIN:5 in the last 168 hours No results found for this basename: LIPASE:5,AMYLASE:5 in the last 168 hours No results found for this basename: AMMONIA:5 in the last 168 hours CBC:  Lab 09/14/12 1745  WBC 10.1  NEUTROABS 6.6  HGB 13.5  HCT 41.1  MCV 88.8  PLT 249   Cardiac Enzymes: No results found for this basename: CKTOTAL:5,CKMB:5,CKMBINDEX:5,TROPONINI:5 in the last 168 hours BNP (last 3 results) No results found for this basename: PROBNP:3 in the last 8760 hours CBG:  Lab 09/16/12 0018  GLUCAP 102*    No results found for this or any previous visit (from the past 240 hour(s)).   Studies: No results found.  Scheduled Meds:   . amLODipine  10 mg Oral Daily  . atorvastatin  20 mg Oral q1800  . clopidogrel  75 mg Oral Q breakfast  . irbesartan  75 mg Oral Daily  . sodium chloride  3 mL Intravenous Q12H  . sodium chloride  3 mL Intravenous Q12H  . [DISCONTINUED] aspirin EC  81 mg Oral Daily  . [DISCONTINUED] simvastatin  20 mg Oral q1800  . [  DISCONTINUED] simvastatin  40 mg Oral q1800   Continuous Infusions:   . sodium chloride      Principal Problem:  *Cerebral infarct Active Problems:  Transient ischemic attack (TIA), and cerebral infarction without residual deficits  HTN (hypertension)  GERD (gastroesophageal reflux disease)  Hyperlipidemia LDL goal < 100  Weakness of right leg    Time spent: > 35 mins    THOMPSON,DANIEL  [facility] Pager [phone]. If 8PM-8AM, please contact night-coverage at [email or web address], password [email or web address] 09/17/2012, 9:03 AM  LOS: 3 days              

## 2012-09-17 NOTE — Progress Notes (Signed)
Patient requested to cancel her TEE and leave the hospital understanding her TEE would have to be done on an outpatient basis.   MD notified of request for early discharge.    Shari Taylor

## 2012-09-17 NOTE — CV Procedure (Signed)
See full TEE report in [employer]; normal LV function; moderate atherosclerosis in descending aorta. Shari Taylor

## 2012-09-17 NOTE — Progress Notes (Signed)
Stroke Team Progress Note  HISTORY HPI: Shari Taylor is a 56 y.o. female who presented with right leg weakness that started Tuesday morning 09/09/12. She states the weakness was present on awakening, and she felt she might have slept on it funny. She subsequently did not have improvement and therefore felt like she might have had a stroke. She states that she felt that she might get better and therefore did not seek treatment. She has had no improvement and therefore has sought treatment at this time. She denies back pain, leg pain, numbness, weakness of her arm or face. She was admitted to [facility] on 09/14/12 by Dr. Amada Jupiter. The patient was not a TPA candidate secondary to late presentation.Marland Kitchen She was admitted to the neuro ICU for further evaluation and treatment.  SUBJECTIVE No one is at the bedside. No new events. Right leg weakness. BP has been labile as outpatient.  OBJECTIVE Most recent Vital Signs: Filed Vitals:   09/17/12 1157 09/17/12 1200 09/17/12 1210 09/17/12 1338  BP:  122/72 142/97 111/57  Pulse:    70  Temp: 97.8 F (36.6 C)   97.6 F (36.4 C)  TempSrc:    Oral  Resp:  21 20 18   Height:      Weight:      SpO2:  94% 94% 98%   CBG (last 3)   Basename 09/16/12 0018  GLUCAP 102*    IV Fluid Intake:      . [DISCONTINUED] sodium chloride      MEDICATIONS     . amLODipine  10 mg Oral Daily  . atorvastatin  40 mg Oral q1800  . clopidogrel  75 mg Oral Q breakfast  . irbesartan  75 mg Oral Daily  . pantoprazole  40 mg Oral Q0600  . sodium chloride  3 mL Intravenous Q12H  . sodium chloride  3 mL Intravenous Q12H  . [DISCONTINUED] aspirin EC  81 mg Oral Daily  . [DISCONTINUED] atorvastatin  20 mg Oral q1800  . [DISCONTINUED] simvastatin  20 mg Oral q1800  . [DISCONTINUED] simvastatin  40 mg Oral q1800   PRN:  sodium chloride, sodium chloride, [DISCONTINUED] butamben-tetracaine-benzocaine, [DISCONTINUED] meperidine, [DISCONTINUED]  midazolam  Diet:  Cardiac with thin liquids Activity:  Up with assistance. DVT Prophylaxis:  SCDs  CLINICALLY SIGNIFICANT STUDIES Basic Metabolic Panel:   Lab 09/14/12 1745  NA 141  K 3.6  CL 105  CO2 22  GLUCOSE 108*  BUN 14  CREATININE 0.70  CALCIUM 9.1  MG --  PHOS --   Liver Function Tests: No results found for this basename: AST:2,ALT:2,ALKPHOS:2,BILITOT:2,PROT:2,ALBUMIN:2 in the last 168 hours CBC:   Lab 09/14/12 1745  WBC 10.1  NEUTROABS 6.6  HGB 13.5  HCT 41.1  MCV 88.8  PLT 249   Coagulation: No results found for this basename: LABPROT:4,INR:4 in the last 168 hours Cardiac Enzymes: No results found for this basename: CKTOTAL:3,CKMB:3,CKMBINDEX:3,TROPONINI:3 in the last 168 hours Urinalysis: No results found for this basename: COLORURINE:2,APPERANCEUR:2,LABSPEC:2,PHURINE:2,GLUCOSEU:2,HGBUR:2,BILIRUBINUR:2,KETONESUR:2,PROTEINUR:2,UROBILINOGEN:2,NITRITE:2,LEUKOCYTESUR:2 in the last 168 hours Lipid Panel    Component Value Date/Time   CHOL 213* 09/16/2012 0609   TRIG 209* 09/16/2012 0609   HDL 38* 09/16/2012 0609   CHOLHDL 5.6 09/16/2012 0609   VLDL 42* 09/16/2012 0609   LDLCALC 133* 09/16/2012 0609   HgbA1C  Lab Results  Component Value Date   HGBA1C 6.0* 09/16/2012    Urine Drug Screen:   No results found for this basename: labopia,  cocainscrnur,  labbenz,  amphetmu,  thcu,  labbarb    Alcohol Level: No results found for this basename: ETH:2 in the last 168 hours      Dg Lumbar Spine 2-3vclearing  09/15/2012  *RADIOLOGY REPORT*  IMPRESSION:  1.  Normal alignment. 2.  Slightly diminished disc space at L5-S1. 3.  Probable degenerative change involving the facet joints of L4-5 and L5-S1.    CT of the brain Old left periventricular white matter and right basal ganglia lacunar infarcts. No acute intracranial abnormalities.     MRI of the brain  Mr Brain Wo Contrast Small region of acute infarction affecting the left basal ganglia and posterior limb  internal capsule.  No swelling, mass effect or hemorrhage.  Old small vessel infarctions as outlined.  2D Echocardiogram  Normal LV size and systolic function with mild LV hypertrophy. EF 60-65%. Normal RV size and systolic function. No significant valvular abnormalities.  Carotid Doppler---pending    Therapy Recommendations---outpatient  Physical Exam   GENERAL EXAM: Patient is in no distress  CARDIOVASCULAR: Regular rate and rhythm, no murmurs, no carotid bruits  NEUROLOGIC: MENTAL STATUS: awake, alert, language fluent, comprehension intact, naming intact CRANIAL NERVE: pupils equal and reactive to light, visual fields full to confrontation, extraocular muscles intact, no nystagmus, facial sensation and strength symmetric, uvula midline, shoulder shrug symmetric, tongue midline. MOTOR: normal bulk and tone, RUE DRIFT, RLE 3/5. LEFT SIDE FULL STRENGTH.  SENSORY: normal and symmetric to light touch, pinprick, temperature COORDINATION: finger-nose-finger, fine finger movements, heel-shin normal REFLEXES: deep tendon reflexes present and symmetric GAIT/STATION: SITTING BY WINDOW.  ASSESSMENT  [name] is a 56 y.o. female presenting with right leg weakness. No TPA secondary to late presentation. Imaging confirms a left basal ganglia and posterior limb internal capsule acute infarct. Infarct felt to be thrombotic secondary to small vessel disease. Admitted in March 2013 for a brainstem CVA. Not seen on MRI. Patient states now that she was on baby aspirin 81mg  a day and plavix 75mg  daily prior to admission after her last stroke. Will D0 TEE given history of bilateral strokes at such a young age.    Hyperlipidemia - Zocor  Hypertension  Cerebrovascular disease, previous brainstem stroke in March 2013  Hospital day # 3  TREATMENT/PLAN  Would continue clopidogrel 75 mg orally every morning for 1 week and add 1 aggrenox at night for one week. After that time, discontinue  the plavix and change the aggrenox to twice a day for secondary stroke prevention.  Carotid Dopplers remain pending  TEE showed no evidence of embolic source.  Hypercoagulable workup in March negative.  TEE unrevealing, please schedule an outpatient TCD bubble study with emboli monitoring with Dr. Pearlean Brownie in 1 month to further evaluate for possible PFO. Have patient call for appointment. (I have added to discharge instruction sheet).  Outpatient telemetry monitoring: Patient will be called with this after discharge If carotid dopplers normal, may be discharged from neurological standpoint Followup with Dr. Pearlean Brownie in 2 mos.  Guy Franco, [facility],  MBA, MHA [facility] Pager: [phone] 09/17/2012 3:42 PM  Scribe for Dr. Delia Heady, Stroke Center Medical Director. He has personally reviewed chart, pertinent data, examined the patient and developed the plan of care. Pager:  [phone]

## 2012-09-18 ENCOUNTER — Encounter (HOSPITAL_COMMUNITY): Payer: Self-pay | Admitting: Cardiology

## 2012-09-18 NOTE — Care Management Note (Signed)
    Page 1 of 1   09/18/2012     12:13:50 PM   CARE MANAGEMENT NOTE 09/18/2012  Patient:  Shari Taylor, Shari Taylor   Account Number:  [id number]  Date Initiated:  09/16/2012  Documentation initiated by:  [facility]  Subjective/Objective Assessment:   Admitted with rt leg weakness.     Action/Plan:   PT/OT evals-recommending outpatiet PT   Anticipated DC Date:  09/17/2012   Anticipated DC Plan:  HOME/SELF CARE      DC Planning Services  CM consult      Choice offered to / List presented to:             Status of service:  Completed, signed off Medicare Important Message given?   (If response is "NO", the following Medicare IM given date fields will be blank) Date Medicare IM given:   Date Additional Medicare IM given:    Discharge Disposition:  HOME/SELF CARE  Per UR Regulation:  Reviewed for med. necessity/level of care/duration of stay  If discussed at Long Length of Stay Meetings, dates discussed:    Comments:  09/18/12 Spoke with patient about outpatient therapy, she did not feel that she needs outpatient OT but is agreeable to outpatient PT at the [facility] location on [address]. Gave patient address and phone #. On 09/18/12 faxed order, referral, PT eval, and d/c summary to [phone]. Conformed receipt, they will contact patient to make appt. Jacquelynn Cree RN, BSN, CCM

## 2012-09-19 ENCOUNTER — Ambulatory Visit: Payer: 59 | Attending: Family Medicine | Admitting: Physical Therapy

## 2012-09-19 DIAGNOSIS — M6281 Muscle weakness (generalized): Secondary | ICD-10-CM | POA: Insufficient documentation

## 2012-09-19 DIAGNOSIS — R269 Unspecified abnormalities of gait and mobility: Secondary | ICD-10-CM | POA: Insufficient documentation

## 2012-09-19 DIAGNOSIS — I69998 Other sequelae following unspecified cerebrovascular disease: Secondary | ICD-10-CM | POA: Insufficient documentation

## 2012-09-19 DIAGNOSIS — IMO0001 Reserved for inherently not codable concepts without codable children: Secondary | ICD-10-CM | POA: Insufficient documentation

## 2012-09-22 ENCOUNTER — Ambulatory Visit: Payer: 59 | Admitting: Physical Therapy

## 2012-09-23 ENCOUNTER — Ambulatory Visit: Payer: 59 | Admitting: Physical Therapy

## 2012-09-23 ENCOUNTER — Telehealth: Payer: Self-pay | Admitting: *Deleted

## 2012-09-23 NOTE — Telephone Encounter (Signed)
Left message for patient to call and schedule event monitor.  

## 2012-09-24 ENCOUNTER — Ambulatory Visit: Payer: 59 | Admitting: Physical Therapy

## 2012-09-26 NOTE — Telephone Encounter (Signed)
I tried reaching Ms. Savo to scheule event monitor to either be done in our office or mailed to her home. Unable to reach patient. Message left on voicemail.

## 2012-09-29 ENCOUNTER — Telehealth: Payer: Self-pay | Admitting: *Deleted

## 2012-09-29 NOTE — Telephone Encounter (Signed)
3rd attempt to call back to schedule event monitor ordered by Dr. Pearlean Brownie per Guy Franco PA-C.

## 2012-09-30 ENCOUNTER — Ambulatory Visit: Payer: 59 | Attending: Family Medicine | Admitting: Physical Therapy

## 2012-09-30 DIAGNOSIS — IMO0001 Reserved for inherently not codable concepts without codable children: Secondary | ICD-10-CM | POA: Insufficient documentation

## 2012-09-30 DIAGNOSIS — R269 Unspecified abnormalities of gait and mobility: Secondary | ICD-10-CM | POA: Insufficient documentation

## 2012-09-30 DIAGNOSIS — M6281 Muscle weakness (generalized): Secondary | ICD-10-CM | POA: Insufficient documentation

## 2012-09-30 DIAGNOSIS — I69998 Other sequelae following unspecified cerebrovascular disease: Secondary | ICD-10-CM | POA: Insufficient documentation

## 2012-10-02 ENCOUNTER — Ambulatory Visit: Payer: 59 | Admitting: Physical Therapy

## 2012-10-02 NOTE — Telephone Encounter (Signed)
Left message for Shari Taylor to call and schedule event monitor ordered per [employer]. I have also sent a message to Alverda Skeans Dr. Pearlean Brownie nurse. Maybe Andrey Campanile will be able reach patient.

## 2012-10-03 ENCOUNTER — Ambulatory Visit: Payer: 59 | Admitting: Physical Therapy

## 2012-10-06 NOTE — Telephone Encounter (Signed)
10-03-12/[name] called and wanted to know why she needed the monitor.  She stated " They had me on a monitor while in the hospital".  I explained that Dr. Pearlean Brownie had requested the monitor and I didn't know why the wanted another one.  She refused the monitor and was going to call them to discuss this.   Spoke with [place] and told her what had happen with [name].      10-06-12  Sandy call and stated she was unable to get records from the hospital.  I told her I would fax over the the Discharge summary and Dr. Pearlean Brownie consult note.

## 2012-10-07 ENCOUNTER — Ambulatory Visit: Payer: 59 | Admitting: Physical Therapy

## 2012-10-08 ENCOUNTER — Ambulatory Visit: Payer: 59 | Admitting: Physical Therapy

## 2012-10-10 ENCOUNTER — Ambulatory Visit: Payer: 59 | Admitting: Physical Therapy

## 2012-10-13 ENCOUNTER — Ambulatory Visit: Payer: 59 | Admitting: Physical Therapy

## 2012-10-14 ENCOUNTER — Ambulatory Visit: Payer: 59 | Admitting: Physical Therapy

## 2012-10-17 ENCOUNTER — Ambulatory Visit: Payer: 59 | Admitting: Physical Therapy

## 2012-10-20 ENCOUNTER — Ambulatory Visit: Payer: 59 | Admitting: Physical Therapy

## 2012-10-23 ENCOUNTER — Ambulatory Visit: Payer: 59 | Admitting: Physical Therapy

## 2012-10-24 ENCOUNTER — Ambulatory Visit: Payer: 59 | Admitting: Physical Therapy

## 2012-10-27 ENCOUNTER — Ambulatory Visit: Payer: 59 | Admitting: Physical Therapy

## 2012-10-30 ENCOUNTER — Ambulatory Visit: Payer: 59 | Attending: Family Medicine | Admitting: Physical Therapy

## 2012-10-30 DIAGNOSIS — I69998 Other sequelae following unspecified cerebrovascular disease: Secondary | ICD-10-CM | POA: Insufficient documentation

## 2012-10-30 DIAGNOSIS — M6281 Muscle weakness (generalized): Secondary | ICD-10-CM | POA: Insufficient documentation

## 2012-10-30 DIAGNOSIS — IMO0001 Reserved for inherently not codable concepts without codable children: Secondary | ICD-10-CM | POA: Insufficient documentation

## 2012-10-30 DIAGNOSIS — R269 Unspecified abnormalities of gait and mobility: Secondary | ICD-10-CM | POA: Insufficient documentation

## 2012-10-31 ENCOUNTER — Ambulatory Visit: Payer: 59 | Admitting: Physical Therapy

## 2012-11-03 ENCOUNTER — Ambulatory Visit: Payer: 59 | Admitting: Physical Therapy

## 2012-11-05 ENCOUNTER — Ambulatory Visit: Payer: 59 | Admitting: Physical Therapy

## 2012-11-07 ENCOUNTER — Ambulatory Visit: Payer: 59 | Admitting: Physical Therapy

## 2012-11-10 ENCOUNTER — Ambulatory Visit: Payer: 59 | Admitting: Physical Therapy

## 2012-11-12 ENCOUNTER — Ambulatory Visit: Payer: 59 | Admitting: Physical Therapy

## 2012-11-14 ENCOUNTER — Ambulatory Visit: Payer: 59 | Admitting: Rehabilitative and Restorative Service Providers"

## 2012-11-17 ENCOUNTER — Ambulatory Visit: Payer: 59 | Admitting: Physical Therapy

## 2012-11-19 ENCOUNTER — Ambulatory Visit: Payer: 59 | Admitting: *Deleted

## 2012-11-19 ENCOUNTER — Ambulatory Visit: Payer: 59 | Admitting: Physical Therapy

## 2012-11-21 ENCOUNTER — Ambulatory Visit: Payer: 59 | Admitting: Physical Therapy

## 2012-11-24 ENCOUNTER — Ambulatory Visit: Payer: 59 | Admitting: Physical Therapy

## 2012-11-26 ENCOUNTER — Ambulatory Visit: Payer: 59 | Admitting: Physical Therapy

## 2012-11-28 ENCOUNTER — Ambulatory Visit: Payer: 59 | Admitting: Physical Therapy

## 2012-12-02 ENCOUNTER — Ambulatory Visit: Payer: 59 | Admitting: *Deleted

## 2012-12-03 ENCOUNTER — Ambulatory Visit: Payer: 59 | Attending: Family Medicine | Admitting: Physical Therapy

## 2012-12-03 DIAGNOSIS — M6281 Muscle weakness (generalized): Secondary | ICD-10-CM | POA: Insufficient documentation

## 2012-12-03 DIAGNOSIS — I69998 Other sequelae following unspecified cerebrovascular disease: Secondary | ICD-10-CM | POA: Insufficient documentation

## 2012-12-03 DIAGNOSIS — IMO0001 Reserved for inherently not codable concepts without codable children: Secondary | ICD-10-CM | POA: Insufficient documentation

## 2012-12-03 DIAGNOSIS — R269 Unspecified abnormalities of gait and mobility: Secondary | ICD-10-CM | POA: Insufficient documentation

## 2012-12-05 ENCOUNTER — Ambulatory Visit: Payer: 59 | Admitting: Physical Therapy

## 2012-12-10 ENCOUNTER — Ambulatory Visit: Payer: 59 | Admitting: Physical Therapy

## 2012-12-12 ENCOUNTER — Ambulatory Visit: Payer: 59 | Admitting: Physical Therapy

## 2012-12-17 ENCOUNTER — Ambulatory Visit: Payer: 59 | Admitting: Physical Therapy

## 2012-12-19 ENCOUNTER — Ambulatory Visit: Payer: 59 | Admitting: Physical Therapy

## 2012-12-24 ENCOUNTER — Ambulatory Visit: Payer: BC Managed Care – PPO | Admitting: Physical Therapy

## 2012-12-26 ENCOUNTER — Ambulatory Visit: Payer: BC Managed Care – PPO | Admitting: Physical Therapy

## 2013-09-03 ENCOUNTER — Other Ambulatory Visit: Payer: Self-pay

## 2014-05-30 ENCOUNTER — Encounter (HOSPITAL_COMMUNITY): Payer: Self-pay | Admitting: Emergency Medicine

## 2014-05-30 ENCOUNTER — Emergency Department (HOSPITAL_COMMUNITY)
Admission: EM | Admit: 2014-05-30 | Discharge: 2014-05-31 | Disposition: A | Discharge status: Home / Self Care | Payer: BC Managed Care – PPO | Attending: Emergency Medicine | Admitting: Emergency Medicine

## 2014-05-30 DIAGNOSIS — Z880 Allergy status to penicillin: Secondary | ICD-10-CM | POA: Insufficient documentation

## 2014-05-30 DIAGNOSIS — I10 Essential (primary) hypertension: Secondary | ICD-10-CM | POA: Insufficient documentation

## 2014-05-30 DIAGNOSIS — R5383 Other fatigue: Secondary | ICD-10-CM

## 2014-05-30 DIAGNOSIS — Z87738 Personal history of other specified (corrected) congenital malformations of digestive system: Secondary | ICD-10-CM | POA: Insufficient documentation

## 2014-05-30 DIAGNOSIS — R5381 Other malaise: Secondary | ICD-10-CM | POA: Insufficient documentation

## 2014-05-30 DIAGNOSIS — R319 Hematuria, unspecified: Secondary | ICD-10-CM | POA: Insufficient documentation

## 2014-05-30 DIAGNOSIS — K219 Gastro-esophageal reflux disease without esophagitis: Secondary | ICD-10-CM | POA: Insufficient documentation

## 2014-05-30 DIAGNOSIS — Z79899 Other long term (current) drug therapy: Secondary | ICD-10-CM | POA: Insufficient documentation

## 2014-05-30 DIAGNOSIS — Z8673 Personal history of transient ischemic attack (TIA), and cerebral infarction without residual deficits: Secondary | ICD-10-CM | POA: Insufficient documentation

## 2014-05-30 LAB — URINE MICROSCOPIC-ADD ON

## 2014-05-30 LAB — CBC
HCT: 44.3 % (ref 36.0–46.0)
Hemoglobin: 14.8 g/dL (ref 12.0–15.0)
MCH: 29.4 pg (ref 26.0–34.0)
MCHC: 33.4 g/dL (ref 30.0–36.0)
MCV: 88.1 fL (ref 78.0–100.0)
PLATELETS: 250 10*3/uL (ref 150–400)
RBC: 5.03 MIL/uL (ref 3.87–5.11)
RDW: 13.6 % (ref 11.5–15.5)
WBC: 9 10*3/uL (ref 4.0–10.5)

## 2014-05-30 LAB — BASIC METABOLIC PANEL
ANION GAP: 15 (ref 5–15)
BUN: 14 mg/dL (ref 6–23)
CALCIUM: 9.7 mg/dL (ref 8.4–10.5)
CO2: 22 mEq/L (ref 19–32)
Chloride: 105 mEq/L (ref 96–112)
Creatinine, Ser: 0.82 mg/dL (ref 0.50–1.10)
GFR, EST AFRICAN AMERICAN: 89 mL/min — AB (ref >90)
GFR, EST NON AFRICAN AMERICAN: 77 mL/min — AB (ref >90)
Glucose, Bld: 114 mg/dL — ABNORMAL HIGH (ref 70–99)
POTASSIUM: 3.9 meq/L (ref 3.7–5.3)
SODIUM: 142 meq/L (ref 137–147)

## 2014-05-30 LAB — URINALYSIS, ROUTINE W REFLEX MICROSCOPIC
BILIRUBIN URINE: NEGATIVE
Glucose, UA: NEGATIVE mg/dL
KETONES UR: NEGATIVE mg/dL
NITRITE: NEGATIVE
Protein, ur: 100 mg/dL — AB
Specific Gravity, Urine: 1.021 (ref 1.005–1.030)
UROBILINOGEN UA: 0.2 mg/dL (ref 0.0–1.0)
pH: 5 (ref 5.0–8.0)

## 2014-05-30 NOTE — ED Notes (Signed)
Pt reports gross hematuria x 3 days. Hx of hematuria in past. Pt began feeling weak today. Denies nv, fever, chills, pain. sts trouble controlling her bp since this started.

## 2014-05-30 NOTE — ED Notes (Signed)
Patient states she has a long history of hematuria and the reason she came in today was because of the increase in her generalized weakness. States she has not taken her plavix or her aggrenox in a year.

## 2014-05-31 ENCOUNTER — Emergency Department (HOSPITAL_COMMUNITY): Payer: BC Managed Care – PPO

## 2014-05-31 LAB — I-STAT TROPONIN, ED: TROPONIN I, POC: 0 ng/mL (ref 0.00–0.08)

## 2014-05-31 MED ORDER — SODIUM CHLORIDE 0.9 % IV BOLUS (SEPSIS)
1000.0000 mL | Freq: Once | INTRAVENOUS | Status: AC
  Administered 2014-05-31: 1000 mL via INTRAVENOUS

## 2014-05-31 NOTE — Discharge Instructions (Signed)
Fatigue °Fatigue is a feeling of tiredness, lack of energy, lack of motivation, or feeling tired all the time. Having enough rest, good nutrition, and reducing stress will normally reduce fatigue. Consult your caregiver if it persists. The nature of your fatigue will help your caregiver to find out its cause. The treatment is based on the cause.  °CAUSES  °There are many causes for fatigue. Most of the time, fatigue can be traced to one or more of your habits or routines. Most causes fit into one or more of three general areas. They are: °Lifestyle problems °· Sleep disturbances. °· Overwork. °· Physical exertion. °· Unhealthy habits. °· Poor eating habits or eating disorders. °· Alcohol and/or drug use . °· Lack of proper nutrition (malnutrition). °Psychological problems °· Stress and/or anxiety problems. °· Depression. °· Grief. °· Boredom. °Medical Problems or Conditions °· Anemia. °· Pregnancy. °· Thyroid gland problems. °· Recovery from major surgery. °· Continuous pain. °· Emphysema or asthma that is not well controlled °· Allergic conditions. °· Diabetes. °· Infections (such as mononucleosis). °· Obesity. °· Sleep disorders, such as sleep apnea. °· Heart failure or other heart-related problems. °· Cancer. °· Kidney disease. °· Liver disease. °· Effects of certain medicines such as antihistamines, cough and cold remedies, prescription pain medicines, heart and blood pressure medicines, drugs used for treatment of cancer, and some antidepressants. °SYMPTOMS  °The symptoms of fatigue include:  °· Lack of energy. °· Lack of drive (motivation). °· Drowsiness. °· Feeling of indifference to the surroundings. °DIAGNOSIS  °The details of how you feel help guide your caregiver in finding out what is causing the fatigue. You will be asked about your present and past health condition. It is important to review all medicines that you take, including prescription and non-prescription items. A thorough exam will be done.  You will be questioned about your feelings, habits, and normal lifestyle. Your caregiver may suggest blood tests, urine tests, or other tests to look for common medical causes of fatigue.  °TREATMENT  °Fatigue is treated by correcting the underlying cause. For example, if you have continuous pain or depression, treating these causes will improve how you feel. Similarly, adjusting the dose of certain medicines will help in reducing fatigue.  °HOME CARE INSTRUCTIONS  °· Try to get the required amount of good sleep every night. °· Eat a healthy and nutritious diet, and drink enough water throughout the day. °· Practice ways of relaxing (including yoga or meditation). °· Exercise regularly. °· Make plans to change situations that cause stress. Act on those plans so that stresses decrease over time. Keep your work and personal routine reasonable. °· Avoid street drugs and minimize use of alcohol. °· Start taking a daily multivitamin after consulting your caregiver. °SEEK MEDICAL CARE IF:  °· You have persistent tiredness, which cannot be accounted for. °· You have fever. °· You have unintentional weight loss. °· You have headaches. °· You have disturbed sleep throughout the night. °· You are feeling sad. °· You have constipation. °· You have dry skin. °· You have gained weight. °· You are taking any new or different medicines that you suspect are causing fatigue. °· You are unable to sleep at night. °· You develop any unusual swelling of your legs or other parts of your body. °SEEK IMMEDIATE MEDICAL CARE IF:  °· You are feeling confused. °· Your vision is blurred. °· You feel faint or pass out. °· You develop severe headache. °· You develop severe abdominal, pelvic, or   back pain.  You develop chest pain, shortness of breath, or an irregular or fast heartbeat.  You are unable to pass a normal amount of urine.  You develop abnormal bleeding such as bleeding from the rectum or you vomit blood.  You have thoughts  about harming yourself or committing suicide.  You are worried that you might harm someone else. MAKE SURE YOU:   Understand these instructions.  Will watch your condition.  Will get help right away if you are not doing well or get worse. Document Released: 08/12/2007 Document Revised: 01/07/2012 Document Reviewed: 02/16/2014 [facility] Patient Information 2015 [place], [place]. This information is not intended to replace advice given to you by your health care provider. Make sure you discuss any questions you have with your health care provider.  Hematuria Hematuria is blood in your urine. It can be caused by a bladder infection, kidney infection, prostate infection, kidney stone, or cancer of your urinary tract. Infections can usually be treated with medicine, and a kidney stone usually will pass through your urine. If neither of these is the cause of your hematuria, further workup to find out the reason may be needed. It is very important that you tell your health care provider about any blood you see in your urine, even if the blood stops without treatment or happens without causing pain. Blood in your urine that happens and then stops and then happens again can be a symptom of a very serious condition. Also, pain is not a symptom in the initial stages of many urinary cancers. HOME CARE INSTRUCTIONS   Drink lots of fluid, 3-4 quarts a day. If you have been diagnosed with an infection, cranberry juice is especially recommended, in addition to large amounts of water.  Avoid caffeine, tea, and carbonated beverages because they tend to irritate the bladder.  Avoid alcohol because it may irritate the prostate.  Take all medicines as directed by your health care provider.  If you were prescribed an antibiotic medicine, finish it all even if you start to feel better.  If you have been diagnosed with a kidney stone, follow your health care provider's instructions regarding straining your urine  to catch the stone.  Empty your bladder often. Avoid holding urine for long periods of time.  After a bowel movement, women should cleanse front to back. Use each tissue only once.  Empty your bladder before and after sexual intercourse if you are a female. SEEK MEDICAL CARE IF:  You develop back pain.  You have a fever.  You have a feeling of sickness in your stomach (nausea) or vomiting.  Your symptoms are not better in 3 days. Return sooner if you are getting worse. SEEK IMMEDIATE MEDICAL CARE IF:   You develop severe vomiting and are unable to keep the medicine down.  You develop severe back or abdominal pain despite taking your medicines.  You begin passing a large amount of blood or clots in your urine.  You feel extremely weak or faint, or you pass out. MAKE SURE YOU:   Understand these instructions.  Will watch your condition.  Will get help right away if you are not doing well or get worse. Document Released: 10/15/2005 Document Revised: 03/01/2014 Document Reviewed: 06/15/2013 [facility] Patient Information 2015 [place], [place]. This information is not intended to replace advice given to you by your health care provider. Make sure you discuss any questions you have with your health care provider.

## 2014-05-31 NOTE — ED Provider Notes (Addendum)
CSN: [id number]     Arrival date & time 05/30/14  8916 History   First MD Initiated Contact with Patient 05/30/14 2315     Chief Complaint  Patient presents with  . Hematuria     (Consider location/radiation/quality/duration/timing/severity/associated sxs/prior Treatment) Patient is a 58 y.o. female presenting with hematuria. The history is provided by the patient.  Hematuria This is a recurrent problem. The current episode started more than 2 days ago. The problem occurs constantly. The problem has not changed since onset.Pertinent negatives include no chest pain, no abdominal pain, no headaches and no shortness of breath. Nothing aggravates the symptoms. Nothing relieves the symptoms. She has tried nothing for the symptoms. The treatment provided no relief.  Also global fatigue, No focal weakness.  Poor appetite  Past Medical History  Diagnosis Date  . Hypertension   . GERD (gastroesophageal reflux disease)   . Crohn disease   . Complication of anesthesia   . PONV (postoperative nausea and vomiting)   . Stroke   . H/O hiatal hernia   . Schatzki's ring    Past Surgical History  Procedure Laterality Date  . Appendectomy    . Abdominal hysterectomy    . Bowel resection    . Left oophorectomy      and fallopian tube removed   . Colonoscopy      polyp removal led to GI bleed and ICU stay  . Esophagogastroduodenoscopy    . Tee without cardioversion  09/17/2012    Procedure: TRANSESOPHAGEAL ECHOCARDIOGRAM (TEE);  Surgeon: Lewayne Bunting, MD;  Location: [facility] ENDOSCOPY;  Service: Cardiovascular;  Laterality: N/A;   No family history on file. History  Substance Use Topics  . Smoking status: Never Smoker   . Smokeless tobacco: Not on file  . Alcohol Use: Yes   OB History   Grav Para Term Preterm Abortions TAB SAB Ect Mult Living                 Review of Systems  Constitutional: Negative for fever.  Respiratory: Negative for shortness of breath.   Cardiovascular: Negative  for chest pain.  Gastrointestinal: Negative for abdominal pain.  Genitourinary: Positive for hematuria. Negative for dysuria and flank pain.  Neurological: Negative for headaches.  All other systems reviewed and are negative.     Allergies  Penicillins; Beef-derived products; Clindamycin/lincomycin; and Fentanyl  Home Medications   Prior to Admission medications   Medication Sig Start Date End Date Taking? Authorizing Provider  Alum Hydroxide-Mag Carbonate (GAVISCON PO) Take 1 tablet by mouth daily as needed. For indigestion/reflux   Yes Historical Provider, MD  amLODipine (NORVASC) 10 MG tablet Take 10 mg by mouth daily.   Yes Historical Provider, MD  bisoprolol (ZEBETA) 10 MG tablet Take 10 mg by mouth daily.   Yes Historical Provider, MD  ranitidine (ZANTAC) 150 MG capsule Take 150 mg by mouth 2 (two) times daily as needed. For reflux   Yes Historical Provider, MD  simvastatin (ZOCOR) 20 MG tablet Take 20 mg by mouth daily.   Yes Historical Provider, MD  valsartan (DIOVAN) 160 MG tablet Take 160 mg by mouth daily.   Yes Historical Provider, MD  valsartan (DIOVAN) 80 MG tablet Take 80 mg by mouth at bedtime.   Yes Historical Provider, MD   BP 157/84  Pulse 72  Temp(Src) 97.7 F (36.5 C) (Oral)  Resp 16  SpO2 96% Physical Exam  Constitutional: She is oriented to person, place, and time. She appears well-developed and well-nourished.  No distress.  HENT:  Head: Normocephalic and atraumatic.  Mouth/Throat: Oropharynx is clear and moist.  Eyes: Conjunctivae are normal. Pupils are equal, round, and reactive to light.  Neck: Normal range of motion. Neck supple.  Cardiovascular: Normal rate, regular rhythm and intact distal pulses.   Pulmonary/Chest: Effort normal and breath sounds normal. She has no wheezes. She has no rales.  Abdominal: Soft. Bowel sounds are normal. There is no tenderness. There is no rebound and no guarding.  Musculoskeletal: Normal range of motion. She exhibits  no edema and no tenderness.  Neurological: She is alert and oriented to person, place, and time. No cranial nerve deficit. She exhibits normal muscle tone. Coordination normal.  Skin: Skin is warm and dry.  Psychiatric: She has a normal mood and affect.    ED Course  Procedures (including critical care time) Labs Review Labs Reviewed  URINALYSIS, ROUTINE W REFLEX MICROSCOPIC - Abnormal; Notable for the following:    Color, Urine AMBER (*)    APPearance TURBID (*)    Hgb urine dipstick LARGE (*)    Protein, ur 100 (*)    Leukocytes, UA TRACE (*)    All other components within normal limits  BASIC METABOLIC PANEL - Abnormal; Notable for the following:    Glucose, Bld 114 (*)    GFR calc non Af Amer 77 (*)    GFR calc Af Amer 89 (*)    All other components within normal limits  URINE MICROSCOPIC-ADD ON - Abnormal; Notable for the following:    Bacteria, UA FEW (*)    All other components within normal limits  CBC  I-STAT TROPOININ, ED    Imaging Review No results found.   EKG Interpretation   Date/Time:  Monday May 31 2014 00:13:55 EDT Ventricular Rate:  69 PR Interval:  160 QRS Duration: 92 QT Interval:  438 QTC Calculation: 469 R Axis:   47 Text Interpretation:  Sinus rhythm Confirmed by [facility]  MD, Grabiela Wohlford  ([id number]) on 05/31/2014 12:55:41 AM      MDM   Final diagnoses:  None    With ambulation, pulse is stable and sats are 98%  No syncope  No ataxia and steady gate.  No focal weakness.  Follow up with urology for painless hematuria    Joeph Szatkowski K Benjy Kana-Rasch, MD 05/31/14 [id number]  Jasmine AweApril K Laelynn Blizzard-Rasch, MD 05/31/14 0430

## 2014-05-31 NOTE — ED Notes (Signed)
Patient was discharged with all personal belongings. 

## 2014-05-31 NOTE — ED Notes (Signed)
Ambulated patient to the restroom without any difficulty or incident.

## 2014-06-19 ENCOUNTER — Encounter: Payer: Self-pay | Admitting: *Deleted

## 2014-07-14 ENCOUNTER — Other Ambulatory Visit: Payer: Self-pay | Admitting: Urology

## 2014-07-16 ENCOUNTER — Encounter (HOSPITAL_COMMUNITY): Payer: Self-pay

## 2014-07-26 ENCOUNTER — Ambulatory Visit (HOSPITAL_COMMUNITY)
Admission: RE | Admit: 2014-07-26 | Discharge: 2014-07-26 | Disposition: A | Discharge status: Home / Self Care | Payer: BC Managed Care – PPO | Source: Ambulatory Visit | Attending: Urology | Admitting: Urology

## 2014-07-26 ENCOUNTER — Encounter (HOSPITAL_COMMUNITY)
Admission: RE | Disposition: A | Discharge status: Home / Self Care | Payer: Self-pay | Source: Ambulatory Visit | Attending: Urology

## 2014-07-26 ENCOUNTER — Ambulatory Visit (HOSPITAL_COMMUNITY): Payer: BC Managed Care – PPO

## 2014-07-26 ENCOUNTER — Encounter (HOSPITAL_COMMUNITY): Payer: Self-pay | Admitting: *Deleted

## 2014-07-26 DIAGNOSIS — K219 Gastro-esophageal reflux disease without esophagitis: Secondary | ICD-10-CM | POA: Insufficient documentation

## 2014-07-26 DIAGNOSIS — N201 Calculus of ureter: Secondary | ICD-10-CM | POA: Insufficient documentation

## 2014-07-26 DIAGNOSIS — I10 Essential (primary) hypertension: Secondary | ICD-10-CM | POA: Insufficient documentation

## 2014-07-26 DIAGNOSIS — Z8719 Personal history of other diseases of the digestive system: Secondary | ICD-10-CM | POA: Insufficient documentation

## 2014-07-26 DIAGNOSIS — Z538 Procedure and treatment not carried out for other reasons: Secondary | ICD-10-CM | POA: Insufficient documentation

## 2014-07-26 DIAGNOSIS — R319 Hematuria, unspecified: Secondary | ICD-10-CM | POA: Diagnosis present

## 2014-07-26 DIAGNOSIS — E7800 Pure hypercholesterolemia, unspecified: Secondary | ICD-10-CM | POA: Diagnosis not present

## 2014-07-26 DIAGNOSIS — Z880 Allergy status to penicillin: Secondary | ICD-10-CM | POA: Diagnosis not present

## 2014-07-26 DIAGNOSIS — Z8673 Personal history of transient ischemic attack (TIA), and cerebral infarction without residual deficits: Secondary | ICD-10-CM | POA: Insufficient documentation

## 2014-07-26 DIAGNOSIS — Z79899 Other long term (current) drug therapy: Secondary | ICD-10-CM | POA: Diagnosis not present

## 2014-07-26 DIAGNOSIS — Z87891 Personal history of nicotine dependence: Secondary | ICD-10-CM | POA: Diagnosis not present

## 2014-07-26 SURGERY — LITHOTRIPSY, ESWL
Anesthesia: LOCAL | Laterality: Right

## 2014-07-26 MED ORDER — DIAZEPAM 5 MG PO TABS
10.0000 mg | ORAL_TABLET | ORAL | Status: DC
  Filled 2014-07-26: qty 2

## 2014-07-26 MED ORDER — DIPHENHYDRAMINE HCL 25 MG PO CAPS
25.0000 mg | ORAL_CAPSULE | ORAL | Status: DC
  Filled 2014-07-26: qty 1

## 2014-07-26 MED ORDER — CIPROFLOXACIN HCL 500 MG PO TABS
500.0000 mg | ORAL_TABLET | ORAL | Status: DC
  Filled 2014-07-26: qty 1

## 2014-07-26 MED ORDER — SODIUM CHLORIDE 0.9 % IV SOLN: INTRAVENOUS | Status: DC

## 2014-07-26 NOTE — Progress Notes (Signed)
Patient arrived via cab and stated she had a ride coming later to take her home (name and number provided to Short Stay).  KUB done and pharmacy tech here to reconcile medications. Patient signed HIPPA form that she does not want anyone to know any information about her (xxx before name on all records). Patient told Short Stay staff no information was to be shared with patient's ride home about patient / today's procedure.  During pre op questions Short Stay RN reviewed d/c instructions and the recommendation that someone be with the patient for 24 hours post procedure. Patient stated she did not have anyone and "it would be impossible for someone to stay with me". Called Steward Drone RN on [employer] truck and informed her. Called Dr. Marlou Porch and informed him. He came to speak to patient. Procedure cancelled for today and office will let her know when rescheduled.

## 2014-07-26 NOTE — Progress Notes (Signed)
For social reasons the case was canceled - patient didn't have anyone to stay with her for the 24hrs following the procedure.  Patient I had discussion of options - she has opted to cancel the procedure and instead undergo PCNL given the advantage of likely only requiring one procedure.  I again discussed all her options and went over risk/benefits of each one.  Having heard these options she again would like to undergo PCNL which we will schedule for her ASAP.

## 2014-07-26 NOTE — H&P (Signed)
Reason For Visit f/u for hematuria w/u   History of Present Illness 58F, patient of Dr. Clint Guy who presents to clinic in follow-up after completing a hematuria work-up. Her cytoscopy was negative. Her CT scan showed bilateral stones. She has a 11mm stone within the renal pelvis on the right. She denies any right flank pain. She denies any fevers/chills. She has passed several stones in the past. She has had ongoing hematuria and states that she feels weak because of it. In particular, she had one episode this past weekend that was severe.  Patient has a history of 2 strokes in the past, she is no longer taking any anticoagulation for this.   Past Medical History Problems  1. History of High cholesterol (272.0) 2. History of esophageal reflux (V12.79) 3. History of hypertension (V12.59) 4. History of stomach ulcers (V12.79) 5. History of stroke (V12.54)  Surgical History Problems  1. History of Appendectomy 2. History of Oophorectomy 3. History of Salpingectomy 4. History of Skin Tag Removal 5. History of Small Bowel Resection 6. History of Total Abdominal Hysterectomy  Current Meds 1. Bystolic 10 MG Oral Tablet;  Therapy: (Recorded:08Sep2015) to Recorded 2. Diovan 160 MG Oral Tablet;  Therapy: (Recorded:08Sep2015) to Recorded 3. Diovan 80 MG Oral Tablet;  Therapy: (Recorded:08Sep2015) to Recorded 4. Gaviscon SUSP;  Therapy: (Recorded:08Sep2015) to Recorded 5. Lipitor 40 MG Oral Tablet;  Therapy: (Recorded:08Sep2015) to Recorded 6. Norvasc TABS;  Therapy: (Recorded:08Sep2015) to Recorded 7. Zantac 150 MG CAPS;  Therapy: (Recorded:08Sep2015) to Recorded  Allergies Medication  1. Penicillins 2. clindamycin  Family History Problems  1. Family history of hypertension (V17.49) : Father, Sister 2. Family history of kidney stones (V18.69) : Father 3. Family history of pancreatic cancer (V16.0) : Father 4. Family history of type 2 diabetes mellitus (V18.0) : Father,  Sister 5. Family history of Melanoma : Mother  Social History Problems  1. Alcohol use (V49.89)   2 glasses per month 2. Death in the family, father   age 41 due to pancreatic cancer 3. Death in the family, mother   age 52 due to melanoma 4. Former smoker (V15.82)   less than a pack a day for 2 years, quit 38 years ago as of 07/06/14 office visit 5. Occasional caffeine consumption  Review of Systems No changes in pts bowel habits, neurological changes, or progressive lower urinary tract symptoms.    Vitals Vital Signs [Data Includes: Last 1 Day]  Recorded: 15Sep2015 01:09PM  Height: 5 ft 3.75 in Weight: 150 lb  BMI Calculated: 25.95 BSA Calculated: 1.73 Blood Pressure: 197 / 99 Heart Rate: 71 Recorded: 15Sep2015 11:57AM  Blood Pressure: 193 / 97 Temperature: 97.9 F Heart Rate: 67  Physical Exam Constitutional: Well nourished and well developed . No acute distress.  ENT:. The ears and nose are normal in appearance.  Neck: The appearance of the neck is normal and no neck mass is present.  Cardiovascular: Heart rate and rhythm are normal . No peripheral edema.  Abdomen: The abdomen is soft and nontender. No masses are palpated. No CVA tenderness. No hernias are palpable. No hepatosplenomegaly noted.  Skin: Normal skin turgor, no visible rash and no visible skin lesions.  Neuro/Psych:. Mood and affect are appropriate.    Results/Data Urine [Data Includes: Last 1 Day]   15Sep2015  COLOR YELLOW   APPEARANCE CLEAR   SPECIFIC GRAVITY 1.020   pH 5.0   GLUCOSE NEG mg/dL  BILIRUBIN NEG   KETONE NEG mg/dL  BLOOD LARGE   PROTEIN  NEG mg/dL  UROBILINOGEN 0.2 mg/dL  NITRITE NEG   LEUKOCYTE ESTERASE NEG   SQUAMOUS EPITHELIAL/HPF FEW   WBC 0-2 WBC/hpf  RBC 7-10 RBC/hpf  BACTERIA FEW   CRYSTALS NONE SEEN   CASTS NONE SEEN    The following images/tracing/specimen were independently visualized: .   CT abdomen/pelvis  Bilateral renal calculi likely accounting for the  patient's  hematuria. The largest calculus measures 11 mm and this in the  midpole region of the right kidney.    No worrisome renal or bladder lesions.    No abdominal masses or adenopathy.    KUB: I obtained a KUB today because I was concerned about her stone being uric acid. The renal shadows are visible bilaterally, there is no visible stone within the expected position of this right UPJ as seen on the patient's CT scan. There are no additional calcifications within the expected trajectories of the ureters or the bladder.  The patient's urinalysis today demonstrates microscopic hematuria.     Assessment Assessed  1. Right ureteral calculus (592.1)  Patient likely has a uric acid stone within the right UPJ.   Plan Gross hematuria  1. KUB; Status:Complete;   Done: 15Sep2015 12:00AM Health Maintenance  2. Changed: From  Bystolic 10 MG Oral Tablet  To Bystolic 10 MG Oral Tablet TAKE 1  TABLET DAILY 3. Changed: From  Diovan 160 MG Oral Tablet  To Valsartan 160 MG Oral Tablet (Diovan)  TAKE 1 TABLET DAILY 4. Changed: From  Diovan 80 MG Oral Tablet  To Valsartan 80 MG Oral Tablet (Diovan)  TAKE 1 TABLET DAILY FOR BLOOD PRESSURE 5. UA With REFLEX; [Do Not Release]; Status:Complete;   Done: 15Sep2015 11:47AM Right ureteral calculus  6. Start: Tamsulosin HCl - 0.4 MG Oral Capsule; TAKE 1 CAPSULE Daily 7. Follow-up Schedule Surgery Office  Follow-up  Status: Hold For - Appointment   Requested for: 15Sep2015  Discussion/Summary We discussed treatment options including ureteroscopy, PCNL, and shockwave lithotripsy. She understands that shockwave lithotripsy would be difficult because of our inability to target the stone. However, if given contrast we may be able to target the stone a little easier. As such, we will plan to do this with the risk of failure as well as [place]. The patient is well aware of these risks and has agreed to proceed.

## 2014-07-27 ENCOUNTER — Other Ambulatory Visit: Payer: Self-pay | Admitting: Urology

## 2014-08-09 ENCOUNTER — Encounter (HOSPITAL_COMMUNITY)
Admission: RE | Admit: 2014-08-09 | Discharge: 2014-08-09 | Disposition: A | Discharge status: Home / Self Care | Payer: BC Managed Care – PPO | Source: Ambulatory Visit | Attending: Urology | Admitting: Urology

## 2014-08-09 ENCOUNTER — Encounter (HOSPITAL_COMMUNITY): Payer: Self-pay

## 2014-08-09 ENCOUNTER — Encounter (HOSPITAL_COMMUNITY): Payer: Self-pay | Admitting: Pharmacy Technician

## 2014-08-09 DIAGNOSIS — R29898 Other symptoms and signs involving the musculoskeletal system: Secondary | ICD-10-CM | POA: Diagnosis not present

## 2014-08-09 DIAGNOSIS — K219 Gastro-esophageal reflux disease without esophagitis: Secondary | ICD-10-CM | POA: Insufficient documentation

## 2014-08-09 DIAGNOSIS — E785 Hyperlipidemia, unspecified: Secondary | ICD-10-CM | POA: Insufficient documentation

## 2014-08-09 DIAGNOSIS — Z0000 Encounter for general adult medical examination without abnormal findings: Secondary | ICD-10-CM | POA: Insufficient documentation

## 2014-08-09 DIAGNOSIS — Z8673 Personal history of transient ischemic attack (TIA), and cerebral infarction without residual deficits: Secondary | ICD-10-CM | POA: Diagnosis not present

## 2014-08-09 DIAGNOSIS — I10 Essential (primary) hypertension: Secondary | ICD-10-CM | POA: Diagnosis not present

## 2014-08-09 DIAGNOSIS — N200 Calculus of kidney: Secondary | ICD-10-CM | POA: Insufficient documentation

## 2014-08-09 HISTORY — DX: Pneumonia, unspecified organism: J18.9

## 2014-08-09 HISTORY — DX: Personal history of other medical treatment: Z92.89

## 2014-08-09 HISTORY — DX: Personal history of urinary calculi: Z87.442

## 2014-08-09 HISTORY — DX: Pure hypercholesterolemia, unspecified: E78.00

## 2014-08-09 LAB — CBC
HCT: 44.4 % (ref 36.0–46.0)
Hemoglobin: 14.6 g/dL (ref 12.0–15.0)
MCH: 29.4 pg (ref 26.0–34.0)
MCHC: 32.9 g/dL (ref 30.0–36.0)
MCV: 89.5 fL (ref 78.0–100.0)
PLATELETS: 255 10*3/uL (ref 150–400)
RBC: 4.96 MIL/uL (ref 3.87–5.11)
RDW: 13.4 % (ref 11.5–15.5)
WBC: 11.3 10*3/uL — ABNORMAL HIGH (ref 4.0–10.5)

## 2014-08-09 LAB — BASIC METABOLIC PANEL
Anion gap: 14 (ref 5–15)
BUN: 14 mg/dL (ref 6–23)
CO2: 22 mEq/L (ref 19–32)
CREATININE: 0.72 mg/dL (ref 0.50–1.10)
Calcium: 9.4 mg/dL (ref 8.4–10.5)
Chloride: 102 mEq/L (ref 96–112)
GFR calc Af Amer: >90 mL/min (ref >90)
Glucose, Bld: 108 mg/dL — ABNORMAL HIGH (ref 70–99)
POTASSIUM: 4 meq/L (ref 3.7–5.3)
Sodium: 138 mEq/L (ref 137–147)

## 2014-08-09 LAB — ABO/RH: ABO/RH(D): O POS

## 2014-08-09 NOTE — Progress Notes (Signed)
Chest x-ray 05/31/14 on EPIC, EKG 05/31/14 on EPIC

## 2014-08-09 NOTE — Patient Instructions (Addendum)
19 Shari Taylor  08/09/2014   Your procedure is scheduled on: Friday 08/13/14  Report to [facility] at 09:30 AM.  Call this number if you have problems the morning of surgery [phone]: [phone]   Remember:   Do not eat food or drink liquids After Midnight.     Take these medicines the morning of surgery with A SIP OF WATER: amlodipine, bystolic, ranitidine if needed, simvastatin   Do not wear jewelry, make-up or nail polish.  Do not wear lotions, powders, or perfumes.   Do not shave 48 hours prior to surgery. Men may shave face and neck.  Do not bring valuables to the hospital.  Contacts, dentures or bridgework may not be worn into surgery.  Leave suitcase in the car. After surgery it may be brought to your room.  Birdie Sons, RN  pre op nurse call if needed [phone]    [facility] - Preparing for Surgery Before surgery, you can play an important role.  Because skin is not sterile, your skin needs to be as free of germs as possible.  You can reduce the number of germs on your skin by washing with CHG (chlorahexidine gluconate) soap before surgery.  CHG is an antiseptic cleaner which kills germs and bonds with the skin to continue killing germs even after washing. Please DO NOT use if you have an allergy to CHG or antibacterial soaps.  If your skin becomes reddened/irritated stop using the CHG and inform your nurse when you arrive at Short Stay. Do not shave (including legs and underarms) for at least 48 hours prior to the first CHG shower.  You may shave your face/neck. Please follow these instructions carefully:  1.  Shower with CHG Soap the night before surgery and the  morning of Surgery.  2.  If you choose to wash your hair, wash your hair first as usual with your  normal  shampoo.  3.  After you shampoo, rinse your hair and body thoroughly to remove the  shampoo.                            4.  Use CHG as you would any other liquid soap.  You can apply chg  directly  to the skin and wash                       Gently with a scrungie or clean washcloth.  5.  Apply the CHG Soap to your body ONLY FROM THE NECK DOWN.   Do not use on face/ open                           Wound or open sores. Avoid contact with eyes, ears mouth and genitals (private parts).                       Wash face,  Genitals (private parts) with your normal soap.             6.  Wash thoroughly, paying special attention to the area where your surgery  will be performed.  7.  Thoroughly rinse your body with warm water from the neck down.  8.  DO NOT shower/wash with your normal soap after using and rinsing off  the CHG Soap.  9.  Pat yourself dry with a clean towel.            10.  Wear clean pajamas.            11.  Place clean sheets on your bed the night of your first shower and do not  sleep with pets. Day of Surgery : Do not apply any lotions/deodorants the morning of surgery.  Please wear clean clothes to the hospital/surgery center.  FAILURE TO FOLLOW THESE INSTRUCTIONS MAY RESULT IN THE CANCELLATION OF YOUR SURGERY PATIENT SIGNATURE_________________________________  NURSE SIGNATURE__________________________________  ________________________________________________________________________  WHAT IS A BLOOD TRANSFUSION? Blood Transfusion Information  A transfusion is the replacement of blood or some of its parts. Blood is made up of multiple cells which provide different functions.  Red blood cells carry oxygen and are used for blood loss replacement.  White blood cells fight against infection.  Platelets control bleeding.  Plasma helps clot blood.  Other blood products are available for specialized needs, such as hemophilia or other clotting disorders. BEFORE THE TRANSFUSION  Who gives blood for transfusions?   Healthy volunteers who are fully evaluated to make sure their blood is safe. This is blood bank blood. Transfusion therapy is the safest  it has ever been in the practice of medicine. Before blood is taken from a donor, a complete history is taken to make sure that person has no history of diseases nor engages in risky social behavior (examples are intravenous drug use or sexual activity with multiple partners). The donor's travel history is screened to minimize risk of transmitting infections, such as malaria. The donated blood is tested for signs of infectious diseases, such as HIV and hepatitis. The blood is then tested to be sure it is compatible with you in order to minimize the chance of a transfusion reaction. If you or a relative donates blood, this is often done in anticipation of surgery and is not appropriate for emergency situations. It takes many days to process the donated blood. RISKS AND COMPLICATIONS Although transfusion therapy is very safe and saves many lives, the main dangers of transfusion include:   Getting an infectious disease.  Developing a transfusion reaction. This is an allergic reaction to something in the blood you were given. Every precaution is taken to prevent this. The decision to have a blood transfusion has been considered carefully by your caregiver before blood is given. Blood is not given unless the benefits outweigh the risks. AFTER THE TRANSFUSION  Right after receiving a blood transfusion, you will usually feel much better and more energetic. This is especially true if your red blood cells have gotten low (anemic). The transfusion raises the level of the red blood cells which carry oxygen, and this usually causes an energy increase.  The nurse administering the transfusion will monitor you carefully for complications. HOME CARE INSTRUCTIONS  No special instructions are needed after a transfusion. You may find your energy is better. Speak with your caregiver about any limitations on activity for underlying diseases you may have. SEEK MEDICAL CARE IF:   Your condition is not improving after  your transfusion.  You develop redness or irritation at the intravenous (IV) site. SEEK IMMEDIATE MEDICAL CARE IF:  Any of the following symptoms occur over the next 12 hours:  Shaking chills.  You have a temperature by mouth above 102 F (38.9 C), not controlled by medicine.  Chest, back, or muscle pain.  People around you feel you are not acting correctly or  are confused.  Shortness of breath or difficulty breathing.  Dizziness and fainting.  You get a rash or develop hives.  You have a decrease in urine output.  Your urine turns a dark color or changes to pink, red, or brown. Any of the following symptoms occur over the next 10 days:  You have a temperature by mouth above 102 F (38.9 C), not controlled by medicine.  Shortness of breath.  Weakness after normal activity.  The white part of the eye turns yellow (jaundice).  You have a decrease in the amount of urine or are urinating less often.  Your urine turns a dark color or changes to pink, red, or brown. Document Released: 10/12/2000 Document Revised: 01/07/2012 Document Reviewed: 05/31/2008 [facility] Patient Information 2014 [place], [place].  _______________________________________________________________________

## 2014-08-10 LAB — URINE CULTURE
Colony Count: NO GROWTH
Culture: NO GROWTH
SPECIAL REQUESTS: NORMAL

## 2014-08-13 ENCOUNTER — Ambulatory Visit (HOSPITAL_COMMUNITY): Payer: BC Managed Care – PPO

## 2014-08-13 ENCOUNTER — Encounter (HOSPITAL_COMMUNITY): Payer: Self-pay | Admitting: *Deleted

## 2014-08-13 ENCOUNTER — Ambulatory Visit (HOSPITAL_COMMUNITY): Payer: BC Managed Care – PPO | Admitting: Anesthesiology

## 2014-08-13 ENCOUNTER — Encounter (HOSPITAL_COMMUNITY)
Admission: RE | Disposition: A | Discharge status: Home / Self Care | Payer: Self-pay | Source: Ambulatory Visit | Attending: Urology

## 2014-08-13 ENCOUNTER — Encounter (HOSPITAL_COMMUNITY): Payer: BC Managed Care – PPO | Admitting: Anesthesiology

## 2014-08-13 ENCOUNTER — Observation Stay (HOSPITAL_COMMUNITY)
Admission: RE | Admit: 2014-08-13 | Discharge: 2014-08-14 | Disposition: A | Discharge status: Home / Self Care | Payer: BC Managed Care – PPO | Source: Ambulatory Visit | Attending: Urology | Admitting: Urology

## 2014-08-13 DIAGNOSIS — Z8719 Personal history of other diseases of the digestive system: Secondary | ICD-10-CM | POA: Insufficient documentation

## 2014-08-13 DIAGNOSIS — E780 Pure hypercholesterolemia: Secondary | ICD-10-CM | POA: Insufficient documentation

## 2014-08-13 DIAGNOSIS — K219 Gastro-esophageal reflux disease without esophagitis: Secondary | ICD-10-CM | POA: Insufficient documentation

## 2014-08-13 DIAGNOSIS — N200 Calculus of kidney: Secondary | ICD-10-CM | POA: Diagnosis not present

## 2014-08-13 DIAGNOSIS — Z87891 Personal history of nicotine dependence: Secondary | ICD-10-CM | POA: Insufficient documentation

## 2014-08-13 DIAGNOSIS — Z8673 Personal history of transient ischemic attack (TIA), and cerebral infarction without residual deficits: Secondary | ICD-10-CM | POA: Insufficient documentation

## 2014-08-13 DIAGNOSIS — I10 Essential (primary) hypertension: Secondary | ICD-10-CM | POA: Diagnosis not present

## 2014-08-13 HISTORY — PX: NEPHROLITHOTOMY: SHX5134

## 2014-08-13 LAB — CBC
HCT: 40.8 % (ref 36.0–46.0)
Hemoglobin: 13.3 g/dL (ref 12.0–15.0)
MCH: 29 pg (ref 26.0–34.0)
MCHC: 32.6 g/dL (ref 30.0–36.0)
MCV: 88.9 fL (ref 78.0–100.0)
PLATELETS: 255 10*3/uL (ref 150–400)
RBC: 4.59 MIL/uL (ref 3.87–5.11)
RDW: 13.4 % (ref 11.5–15.5)
WBC: 14.7 10*3/uL — ABNORMAL HIGH (ref 4.0–10.5)

## 2014-08-13 LAB — TYPE AND SCREEN
ABO/RH(D): O POS
Antibody Screen: NEGATIVE

## 2014-08-13 SURGERY — NEPHROLITHOTOMY PERCUTANEOUS
Anesthesia: General | Laterality: Right

## 2014-08-13 MED ORDER — SODIUM CHLORIDE 0.9 % IJ SOLN
INTRAMUSCULAR | Status: AC
  Filled 2014-08-13: qty 10

## 2014-08-13 MED ORDER — CIPROFLOXACIN IN D5W 400 MG/200ML IV SOLN
INTRAVENOUS | Status: AC
  Filled 2014-08-13: qty 200

## 2014-08-13 MED ORDER — IOHEXOL 300 MG/ML  SOLN
INTRAMUSCULAR | Status: DC | PRN
  Administered 2014-08-13: 46 mL via URETHRAL

## 2014-08-13 MED ORDER — CLINDAMYCIN PHOSPHATE 600 MG/50ML IV SOLN: 600.0000 mg | INTRAVENOUS | Status: DC

## 2014-08-13 MED ORDER — CIPROFLOXACIN HCL 500 MG PO TABS: 500.0000 mg | ORAL_TABLET | Freq: Once | ORAL | Status: DC

## 2014-08-13 MED ORDER — EPHEDRINE SULFATE 50 MG/ML IJ SOLN
INTRAMUSCULAR | Status: DC | PRN
  Administered 2014-08-13 (×2): 5 mg via INTRAVENOUS

## 2014-08-13 MED ORDER — HYDRALAZINE HCL 20 MG/ML IJ SOLN: 5.0000 mg | INTRAMUSCULAR | Status: DC | PRN

## 2014-08-13 MED ORDER — PROPOFOL 10 MG/ML IV BOLUS
INTRAVENOUS | Status: AC
  Filled 2014-08-13: qty 20

## 2014-08-13 MED ORDER — CISATRACURIUM BESYLATE (PF) 10 MG/5ML IV SOLN: INTRAVENOUS | Status: DC | PRN

## 2014-08-13 MED ORDER — PROPOFOL 10 MG/ML IV BOLUS
INTRAVENOUS | Status: DC | PRN
  Administered 2014-08-13: 150 mg via INTRAVENOUS

## 2014-08-13 MED ORDER — ONDANSETRON HCL 4 MG/2ML IJ SOLN
4.0000 mg | Freq: Four times a day (QID) | INTRAMUSCULAR | Status: DC | PRN
Start: 2014-08-13 — End: 2014-08-14

## 2014-08-13 MED ORDER — LACTATED RINGERS IV SOLN
INTRAVENOUS | Status: DC
  Administered 2014-08-13: 1000 mL via INTRAVENOUS
  Administered 2014-08-13: 14:00:00 via INTRAVENOUS

## 2014-08-13 MED ORDER — HYDROMORPHONE HCL 2 MG/ML IJ SOLN
INTRAMUSCULAR | Status: AC
  Filled 2014-08-13: qty 1

## 2014-08-13 MED ORDER — EPHEDRINE SULFATE 50 MG/ML IJ SOLN
INTRAMUSCULAR | Status: AC
  Filled 2014-08-13: qty 1

## 2014-08-13 MED ORDER — TAMSULOSIN HCL 0.4 MG PO CAPS
0.4000 mg | ORAL_CAPSULE | Freq: Every day | ORAL | Status: DC
  Administered 2014-08-13 – 2014-08-14 (×2): 0.4 mg via ORAL
  Filled 2014-08-13 (×2): qty 1

## 2014-08-13 MED ORDER — PROMETHAZINE HCL 25 MG/ML IJ SOLN
6.2500 mg | INTRAMUSCULAR | Status: DC | PRN
Start: 2014-08-13 — End: 2014-08-13

## 2014-08-13 MED ORDER — DSS 100 MG PO CAPS: 100.0000 mg | ORAL_CAPSULE | Freq: Two times a day (BID) | ORAL | Status: AC

## 2014-08-13 MED ORDER — SODIUM CHLORIDE 0.9 % IR SOLN
Status: DC | PRN
Start: 2014-08-13 — End: 2014-08-13
  Administered 2014-08-13: 6000 mL

## 2014-08-13 MED ORDER — BUPIVACAINE HCL (PF) 0.5 % IJ SOLN
INTRAMUSCULAR | Status: AC
  Filled 2014-08-13: qty 30

## 2014-08-13 MED ORDER — MIDAZOLAM HCL 5 MG/5ML IJ SOLN
INTRAMUSCULAR | Status: DC | PRN
  Administered 2014-08-13: 2 mg via INTRAVENOUS

## 2014-08-13 MED ORDER — HYDROMORPHONE HCL 1 MG/ML IJ SOLN
0.2500 mg | INTRAMUSCULAR | Status: DC | PRN
Start: 2014-08-13 — End: 2014-08-13

## 2014-08-13 MED ORDER — DEXAMETHASONE SODIUM PHOSPHATE 10 MG/ML IJ SOLN
INTRAMUSCULAR | Status: DC | PRN
  Administered 2014-08-13: 10 mg via INTRAVENOUS

## 2014-08-13 MED ORDER — BUPIVACAINE HCL (PF) 0.25 % IJ SOLN
INTRAMUSCULAR | Status: DC | PRN
  Administered 2014-08-13: 10 mL

## 2014-08-13 MED ORDER — GLYCOPYRROLATE 0.2 MG/ML IJ SOLN
INTRAMUSCULAR | Status: DC | PRN
  Administered 2014-08-13: 0.6 mg via INTRAVENOUS

## 2014-08-13 MED ORDER — ONDANSETRON HCL 4 MG/2ML IJ SOLN
INTRAMUSCULAR | Status: AC
  Filled 2014-08-13: qty 2

## 2014-08-13 MED ORDER — NEBIVOLOL HCL 10 MG PO TABS
10.0000 mg | ORAL_TABLET | Freq: Every morning | ORAL | Status: DC
Start: 2014-08-14 — End: 2014-08-14
  Administered 2014-08-14: 10 mg via ORAL
  Filled 2014-08-13: qty 1

## 2014-08-13 MED ORDER — DOCUSATE SODIUM 100 MG PO CAPS
100.0000 mg | ORAL_CAPSULE | Freq: Two times a day (BID) | ORAL | Status: DC
  Administered 2014-08-13 – 2014-08-14 (×2): 100 mg via ORAL
  Filled 2014-08-13 (×3): qty 1

## 2014-08-13 MED ORDER — TROSPIUM CHLORIDE ER 60 MG PO CP24: 60.0000 mg | ORAL_CAPSULE | Freq: Every day | ORAL | Status: AC

## 2014-08-13 MED ORDER — HYDROMORPHONE HCL 1 MG/ML IJ SOLN: 0.5000 mg | INTRAMUSCULAR | Status: DC | PRN

## 2014-08-13 MED ORDER — GLYCOPYRROLATE 0.2 MG/ML IJ SOLN
INTRAMUSCULAR | Status: AC
  Filled 2014-08-13: qty 3

## 2014-08-13 MED ORDER — IRBESARTAN 75 MG PO TABS
75.0000 mg | ORAL_TABLET | Freq: Every day | ORAL | Status: DC
  Filled 2014-08-13: qty 1

## 2014-08-13 MED ORDER — ONDANSETRON HCL 4 MG PO TABS: 4.0000 mg | ORAL_TABLET | Freq: Three times a day (TID) | ORAL | Status: DC | PRN

## 2014-08-13 MED ORDER — BELLADONNA ALKALOIDS-OPIUM 16.2-60 MG RE SUPP
1.0000 | Freq: Three times a day (TID) | RECTAL | Status: DC | PRN
  Administered 2014-08-13: 1 via RECTAL
  Filled 2014-08-13: qty 1

## 2014-08-13 MED ORDER — FENTANYL CITRATE 0.05 MG/ML IJ SOLN
INTRAMUSCULAR | Status: AC
  Filled 2014-08-13: qty 5

## 2014-08-13 MED ORDER — DEXAMETHASONE SODIUM PHOSPHATE 10 MG/ML IJ SOLN
INTRAMUSCULAR | Status: AC
  Filled 2014-08-13: qty 1

## 2014-08-13 MED ORDER — LACTATED RINGERS IV SOLN: INTRAVENOUS | Status: DC

## 2014-08-13 MED ORDER — FAMOTIDINE 40 MG PO TABS
40.0000 mg | ORAL_TABLET | Freq: Every day | ORAL | Status: DC
  Filled 2014-08-13: qty 1

## 2014-08-13 MED ORDER — OXYCODONE HCL 5 MG PO TABS: 5.0000 mg | ORAL_TABLET | ORAL | Status: AC | PRN

## 2014-08-13 MED ORDER — ONDANSETRON HCL 4 MG/2ML IJ SOLN
INTRAMUSCULAR | Status: DC | PRN
  Administered 2014-08-13: 4 mg via INTRAVENOUS

## 2014-08-13 MED ORDER — ACETAMINOPHEN 10 MG/ML IV SOLN
1000.0000 mg | Freq: Four times a day (QID) | INTRAVENOUS | Status: DC
  Administered 2014-08-13 – 2014-08-14 (×3): 1000 mg via INTRAVENOUS
  Filled 2014-08-13 (×4): qty 100

## 2014-08-13 MED ORDER — NEOSTIGMINE METHYLSULFATE 10 MG/10ML IV SOLN
INTRAVENOUS | Status: AC
  Filled 2014-08-13: qty 1

## 2014-08-13 MED ORDER — CISATRACURIUM BESYLATE 20 MG/10ML IV SOLN
INTRAVENOUS | Status: AC
  Filled 2014-08-13: qty 10

## 2014-08-13 MED ORDER — SUCCINYLCHOLINE CHLORIDE 20 MG/ML IJ SOLN
INTRAMUSCULAR | Status: DC | PRN
  Administered 2014-08-13: 100 mg via INTRAVENOUS

## 2014-08-13 MED ORDER — CIPROFLOXACIN IN D5W 400 MG/200ML IV SOLN
400.0000 mg | INTRAVENOUS | Status: AC
  Administered 2014-08-13: 400 mg via INTRAVENOUS

## 2014-08-13 MED ORDER — AMLODIPINE BESYLATE 10 MG PO TABS
10.0000 mg | ORAL_TABLET | Freq: Every morning | ORAL | Status: DC
  Administered 2014-08-14: 10 mg via ORAL
  Filled 2014-08-13: qty 1

## 2014-08-13 MED ORDER — SIMVASTATIN 20 MG PO TABS
20.0000 mg | ORAL_TABLET | Freq: Every day | ORAL | Status: DC
  Administered 2014-08-13: 20 mg via ORAL
  Filled 2014-08-13 (×2): qty 1

## 2014-08-13 MED ORDER — VANCOMYCIN HCL IN DEXTROSE 1-5 GM/200ML-% IV SOLN
INTRAVENOUS | Status: AC
  Filled 2014-08-13: qty 200

## 2014-08-13 MED ORDER — OXYCODONE HCL 5 MG PO TABS: 5.0000 mg | ORAL_TABLET | ORAL | Status: DC | PRN

## 2014-08-13 MED ORDER — HYDROMORPHONE HCL 1 MG/ML IJ SOLN
INTRAMUSCULAR | Status: DC | PRN
  Administered 2014-08-13: 0.5 mg via INTRAVENOUS

## 2014-08-13 MED ORDER — MIDAZOLAM HCL 2 MG/2ML IJ SOLN
INTRAMUSCULAR | Status: AC
  Filled 2014-08-13: qty 2

## 2014-08-13 MED ORDER — ACETAMINOPHEN 10 MG/ML IV SOLN
1000.0000 mg | Freq: Once | INTRAVENOUS | Status: AC
  Administered 2014-08-13: 1000 mg via INTRAVENOUS
  Filled 2014-08-13: qty 100

## 2014-08-13 MED ORDER — CISATRACURIUM BESYLATE (PF) 10 MG/5ML IV SOLN
INTRAVENOUS | Status: DC | PRN
  Administered 2014-08-13: 6 mg via INTRAVENOUS
  Administered 2014-08-13 (×2): 2 mg via INTRAVENOUS

## 2014-08-13 MED ORDER — NEOSTIGMINE METHYLSULFATE 10 MG/10ML IV SOLN
INTRAVENOUS | Status: DC | PRN
  Administered 2014-08-13: 4 mg via INTRAVENOUS

## 2014-08-13 MED ORDER — CIPROFLOXACIN HCL 500 MG PO TABS
500.0000 mg | ORAL_TABLET | Freq: Two times a day (BID) | ORAL | Status: DC
  Administered 2014-08-13 – 2014-08-14 (×2): 500 mg via ORAL
  Filled 2014-08-13 (×4): qty 1

## 2014-08-13 MED ORDER — VANCOMYCIN HCL IN DEXTROSE 1-5 GM/200ML-% IV SOLN
1000.0000 mg | INTRAVENOUS | Status: AC
  Administered 2014-08-13: 1000 mg via INTRAVENOUS

## 2014-08-13 MED ORDER — IRBESARTAN 75 MG PO TABS
75.0000 mg | ORAL_TABLET | Freq: Every day | ORAL | Status: DC
  Administered 2014-08-13: 75 mg via ORAL
  Filled 2014-08-13 (×2): qty 1

## 2014-08-13 MED ORDER — SODIUM CHLORIDE 0.9 % IV SOLN
INTRAVENOUS | Status: DC
  Administered 2014-08-13 – 2014-08-14 (×2): via INTRAVENOUS

## 2014-08-13 MED ORDER — HEPARIN SODIUM (PORCINE) 5000 UNIT/ML IJ SOLN
5000.0000 [IU] | Freq: Three times a day (TID) | INTRAMUSCULAR | Status: DC
  Filled 2014-08-13 (×5): qty 1

## 2014-08-13 SURGICAL SUPPLY — 56 items
BAG URINE DRAINAGE (UROLOGICAL SUPPLIES) IMPLANT
BASKET STONE NCOMPASS (UROLOGICAL SUPPLIES) ×2 IMPLANT
BASKET ZERO TIP NITINOL 2.4FR (BASKET) IMPLANT
BENZOIN TINCTURE PRP APPL 2/3 (GAUZE/BANDAGES/DRESSINGS) ×2 IMPLANT
BLADE SURG 15 STRL LF DISP TIS (BLADE) ×1 IMPLANT
BLADE SURG 15 STRL SS (BLADE) ×1
CARTRIDGE STONEBREAK CO2 KIDNE (ELECTROSURGICAL) ×2 IMPLANT
CATH AINSWORTH 30CC 24FR (CATHETERS) IMPLANT
CATH BEACON 5.038 65CM KMP-01 (CATHETERS) ×2 IMPLANT
CATH FOLEY 2W COUNCIL 20FR 5CC (CATHETERS) IMPLANT
CATH FOLEY 2WAY SLVR  5CC 16FR (CATHETERS)
CATH FOLEY 2WAY SLVR 5CC 16FR (CATHETERS) IMPLANT
CATH INTERMIT  6FR 70CM (CATHETERS) ×2 IMPLANT
CATH ROBINSON RED A/P 20FR (CATHETERS) IMPLANT
CATH X-FORCE N30 NEPHROSTOMY (TUBING) ×2 IMPLANT
CHLORAPREP W/TINT 26ML (MISCELLANEOUS) ×2 IMPLANT
COVER SURGICAL LIGHT HANDLE (MISCELLANEOUS) IMPLANT
DRAPE C-ARM 42X120 X-RAY (DRAPES) ×2 IMPLANT
DRAPE CAMERA CLOSED 9X96 (DRAPES) ×2 IMPLANT
DRAPE LINGEMAN PERC (DRAPES) ×2 IMPLANT
DRAPE SHEET LG 3/4 BI-LAMINATE (DRAPES) ×2 IMPLANT
DRAPE SURG IRRIG POUCH 19X23 (DRAPES) ×2 IMPLANT
DRSG PAD ABDOMINAL 8X10 ST (GAUZE/BANDAGES/DRESSINGS) IMPLANT
DRSG TEGADERM 8X12 (GAUZE/BANDAGES/DRESSINGS) IMPLANT
FIBER LASER FLEXIVA 550 (UROLOGICAL SUPPLIES) IMPLANT
FLOSEAL 10ML (HEMOSTASIS) ×2 IMPLANT
GAUZE SPONGE 4X4 12PLY STRL (GAUZE/BANDAGES/DRESSINGS) IMPLANT
GLOVE BIOGEL M STRL SZ7.5 (GLOVE) ×12 IMPLANT
GOWN STRL REUS W/TWL XL LVL3 (GOWN DISPOSABLE) ×4 IMPLANT
GUIDEWIRE AMPLAZ .035X145 (WIRE) ×4 IMPLANT
GUIDEWIRE ANG ZIPWIRE 038X150 (WIRE) ×2 IMPLANT
GUIDEWIRE STR DUAL SENSOR (WIRE) ×2 IMPLANT
KIT BASIN OR (CUSTOM PROCEDURE TRAY) ×2 IMPLANT
MANIFOLD NEPTUNE II (INSTRUMENTS) ×2 IMPLANT
NEEDLE TROCAR 18X15 ECHO (NEEDLE) ×2 IMPLANT
NEEDLE TROCAR 18X20 (NEEDLE) IMPLANT
NS IRRIG 1000ML POUR BTL (IV SOLUTION) ×2 IMPLANT
PACK BASIC VI WITH GOWN DISP (CUSTOM PROCEDURE TRAY) IMPLANT
PACK CYSTO (CUSTOM PROCEDURE TRAY) ×2 IMPLANT
PROBE LITHOCLAST ULTRA 3.8X403 (UROLOGICAL SUPPLIES) IMPLANT
PROBE PNEUMATIC 1.0MMX570MM (UROLOGICAL SUPPLIES) IMPLANT
PROBE PNEUMATIC 1.6MM (ELECTROSURGICAL) ×2 IMPLANT
SET IRRIG Y TYPE TUR BLADDER L (SET/KITS/TRAYS/PACK) IMPLANT
SET WARMING FLUID IRRIGATION (MISCELLANEOUS) IMPLANT
SHEATH PEELAWAY SET 9 (SHEATH) ×2 IMPLANT
SPONGE LAP 4X18 X RAY DECT (DISPOSABLE) ×2 IMPLANT
STENT CONTOUR 7FRX24 (STENTS) ×2 IMPLANT
STONE CATCHER W/TUBE ADAPTER (UROLOGICAL SUPPLIES) IMPLANT
SUT ETHILON 3 0 PS 1 (SUTURE) ×2 IMPLANT
SUT SILK 0 FSL (SUTURE) IMPLANT
SYR 20CC LL (SYRINGE) ×4 IMPLANT
SYRINGE 10CC LL (SYRINGE) ×2 IMPLANT
TOWEL OR 17X26 10 PK STRL BLUE (TOWEL DISPOSABLE) ×2 IMPLANT
TRAY FOLEY CATH 16FRSI W/METER (SET/KITS/TRAYS/PACK) ×2 IMPLANT
TUBING CONNECTING 10 (TUBING) ×4 IMPLANT
WATER STERILE IRR 1500ML POUR (IV SOLUTION) IMPLANT

## 2014-08-13 NOTE — Transfer of Care (Signed)
Immediate Anesthesia Transfer of Care Note  Patient: Shari Taylor  Procedure(s) Performed: Procedure(s): NEPHROLITHOTOMY RIGHT PERCUTANEOUS WITH SURGEON ACCESS (Right)  Patient Location: PACU  Anesthesia Type:General  Level of Consciousness: sedated  Airway & Oxygen Therapy: Patient Spontanous Breathing and Patient connected to face mask oxygen  Post-op Assessment: Report given to PACU RN and Post -op Vital signs reviewed and stable  Post vital signs: Reviewed and stable  Complications: No apparent anesthesia complications

## 2014-08-13 NOTE — Anesthesia Preprocedure Evaluation (Addendum)
Anesthesia Evaluation  Patient identified by MRN, date of birth, ID band Patient awake    Reviewed: Allergy & Precautions, H&P , NPO status , Patient's Chart, lab work & pertinent test results  History of Anesthesia Complications (+) PONV  Airway Mallampati: II TM Distance: >3 FB Neck ROM: Full    Dental no notable dental hx.    Pulmonary neg pulmonary ROS,  breath sounds clear to auscultation  Pulmonary exam normal       Cardiovascular hypertension, Pt. on medications negative cardio ROS  Rhythm:Regular Rate:Normal     Neuro/Psych CVA negative neurological ROS  negative psych ROS   GI/Hepatic negative GI ROS, Neg liver ROS, hiatal hernia,   Endo/Other  negative endocrine ROS  Renal/GU negative Renal ROS  negative genitourinary   Musculoskeletal negative musculoskeletal ROS (+)   Abdominal   Peds negative pediatric ROS (+)  Hematology negative hematology ROS (+)   Anesthesia Other Findings   Reproductive/Obstetrics negative OB ROS                          Anesthesia Physical Anesthesia Plan  ASA: III  Anesthesia Plan: General   Post-op Pain Management:    Induction: Intravenous  Airway Management Planned: Oral ETT  Additional Equipment:   Intra-op Plan:   Post-operative Plan: Extubation in OR  Informed Consent: I have reviewed the patients History and Physical, chart, labs and discussed the procedure including the risks, benefits and alternatives for the proposed anesthesia with the patient or authorized representative who has indicated his/her understanding and acceptance.   Dental advisory given  Plan Discussed with: CRNA  Anesthesia Plan Comments: (No fentanyl. Pt agrees to dilaudid)        Anesthesia Quick Evaluation

## 2014-08-13 NOTE — Progress Notes (Signed)
Postop note  Right tubeless PCNL.  The patient has a 7 [place] 24 cm double-J stent in her right ureter.  The stent tether was left on and tucked into her vagina.  Patient tolerated the procedure well.  She is resting comfortably now in her bed. Complaining of bladder irritation and discomfort. Her flank pain is controlled.   Filed Vitals:   08/13/14 1702  BP: 137/72  Pulse: 53  Temp: 97.7 F (36.5 C)  Resp: 16   The patient is in no acute distress Urine is strawberry Kool-Aid colored Right flank is dressed, clean dry and intact  Plan: The plan is to remove the patient's catheter in the morning. She should then have a KUB. Once her labs are stable and she is otherwise met discharge criteria, she can be discharged home. She has been instructed to remove her stent one week from today. She has followup on October 27th.

## 2014-08-13 NOTE — Anesthesia Postprocedure Evaluation (Signed)
  Anesthesia Post-op Note  Patient: Shari Taylor  Procedure(s) Performed: Procedure(s) (LRB): NEPHROLITHOTOMY RIGHT PERCUTANEOUS WITH SURGEON ACCESS, double J stent (Right)  Patient Location: PACU  Anesthesia Type: General  Level of Consciousness: awake and alert   Airway and Oxygen Therapy: Patient Spontanous Breathing  Post-op Pain: mild  Post-op Assessment: Post-op Vital signs reviewed, Patient's Cardiovascular Status Stable, Respiratory Function Stable, Patent Airway and No signs of Nausea or vomiting  Last Vitals:  Filed Vitals:   08/13/14 1645  BP: 113/51  Pulse: 50  Temp:   Resp: 16    Post-op Vital Signs: stable   Complications: No apparent anesthesia complications

## 2014-08-13 NOTE — Op Note (Signed)
Pre-operative diagnosis: right renal pelvis stone< 2.0  Post-operative diagnosis: as above   Procedure performed: cystoscopy, right retrograde pyelogram with interpretation, right percutaneous renal access, right nephrolithotomy, right ureteral stent placement.   Surgeon: Dr. Crist Fat, MD  Assistant: Jodelle Gross, MD  Anesthesia: General  Complications: None  Specimens: The majority of the stones were removed and will be sent to the [facility] for further analysis.  Findings: 1.  Upper pole renal access, dilated to 22 [place], stone fragmented with pneumatic stone crusher, all fragments removed, 24 cm 7 French ureteral stent placed retrograde, stent tether left on. 2. Retrograde pyelogram demonstrated a normal caliber ureter with a large filling defect within the renal pelvis consistent with the patient's known right renal pelvis stone.  EBL: Approximately 100 cc  Specimens: stone from collecting system - taken to [facility]  Indication: Shari Taylor is a 58 y.o. patient with Gross hematuria.  Her evaluation revealed a large stone within the right renal pelvis. After reviewing the management options for treatment, he elected to proceed with the above surgical procedure(s). We have discussed the potential benefits and risks of the procedure, side effects of the proposed treatment, the likelihood of the patient achieving the goals of the procedure, and any potential problems that might occur during the procedure or recuperation. Informed consent has been obtained.   Description:  Consent was obtained in the preoperative holding area. The patient was marked appropriately and then taken back to the operating room where she was intubated on the gurney. The patient was flipped prone onto the split leg OR table. Large jelly rolls were placed in the anterior axillary line on both sides allowing the patient's chest and abdomen to fall inbetween. The  patient was then prepped and draped in the routine sterile fashion in the right flank and perineal/vaginal area. A timeout was then held confirming the proper side and procedure as well as antibiotics were administered.  I then used the flexible cystoscope and passed gently into the patient's urethra under visual guidance. I then intubated the right ureter passed a 0.38 sensor wire up into the right renal pelvis.  I then removed the scope over the wire leaving the wire in place.  I then passed a 5 [place] open-ended ureteral Pollock catheter over the wire then advanced up to the UPJ. The wire was then removed and a retrograde pyelogram was performed with the above findings. I then turned my attention to the patient's right flank and obtaining percutaneous renal access.  Using the C-arm rotated at 25 and the bulls-eye technique with an 18-gauge coaxial needle the lower pole posterior lateral calyx was targeted.After several failed attempts at targeting the lower pole calyx I then attempted to target the upper pole calyx which was able to do successfully. Then rotating the C-arm AP depth of our needle was noted to be within the calyx and the inner part of the coaxial needle was removed. Urine was noted to return. A 0.38 sensor wire was then passed through the sheath of the coaxial needle and into the right renal collecting system. The wire was then passed down the ureter and into the bladder and ultimately out the urethra for through and through access using fluoroscopic guide,  and the sheath of the needle was removed. An angiographic catheter was then advanced Over the wire and the wire removed. A Super Stiff wire was then passed into the angiographic catheter and the angiographic catheter removed. A 9  French peel-away dilator was then advanced over the Super Stiff wire and passed into the renal pelvis and across the UPJ under fluoroscopic guidance. The inner part of this dilator was removed and the 0.38 sensor  wire was passed alongside the Super Stiff wire through the dilator and into the right ureter, through the ureter and out. The angiographic catheter again was passed over the guidewire and advanced into the bladder, the wire was then removed. A Super Stiff wire was then passed through angiographic catheter and angiographic catheter removed. The outer part of the sheath was then removed, establishing 2 superstiff wires through the Upper pole calyx and into the bladder.   The Serial dilators were then passed over theSuper Stiff wires and the tip guided down into the right upper pole calyx. I used the rigid dilators starting at 2216 JamaicaFrench then 6918 JamaicaFrench and then ultimately 22 [place]French.  At this point I passed a small 22 French sheath over the dilator and into the renal pelvis.     Using the Smallrigid nephroscope to explore the renal pelvis I encountered The large calculus which I was able to Fragment into smaller pieces using the pneumatic stone crusher.  The fragments were then removed with the 3 prong grasper. Then using a flexible cystoscope to navigate the remaining calyces of the kidney multiple smaller stone fragments encountered which were removed using the encompass basket. Inspection of the remaining calyces revealed no additional stone fragments.  We then passed the cystoscope down into the UPJ and noted no additional stone fragments within the ureter.  I then pulled one of the superstiff wires through the access sheath and into the kidney from below and then passed a 24 cm 7 French double-J stent over the wire and under visual guidance passed it up into the right renal pelvis.  I then removed the wire once the stent was at the urethral meatus allowing the distal end of the stent to pop into the bladder with a nice curl.  The stent was slightly repositioned through the access sheath and the kidney to ensure a nice curl within the pelvis.  The stent tether was left on the stent at this time.   The  sensor wire was then backloaded over the rigid nephroscope using the stent pusher and a 24 cm x 6 French double-J ureteral stent was passed antegrade over the sensor wire down into the bladder under fluoroscopic guidance. Once the stent was in the bladder the wire was gently pulled back and a nice curl noted in the bladder. The wire completely removed from the stent, and nice curl on the proximal end of the stent was noted in the renal pelvis. The sheath was then backed out slowly to ensure that all calyces had been inspected and there was nothing behind the sheath.   A Foley catheter was then placed into the patient's bladder.  Surgeon flow was then injected into the access sheath, and the sheath slowly removed.  The area was then injected with 25 cc of local anesthesia into the patient's wound, and the wound was closed with 3-0 nylon in 2 vertical mattress sutures. The incision was then padded using a bundle of 4 x 4's and Hypafix tape. Patient was subsequently rolled over to the supine position and extubated. She was returned to the PACU in excellent condition. At the end of the case all lap and needle and sponges were accounted for. There are no perioperative complications.

## 2014-08-13 NOTE — Interval H&P Note (Signed)
History and Physical Interval Note:  08/13/2014 11:25 AM  Shari Taylor  has presented today for surgery, with the diagnosis of RIGHT NEPHROLITHIASIS  The various methods of treatment have been discussed with the patient and family. After consideration of risks, benefits and other options for treatment, the patient has consented to  Procedure(s): NEPHROLITHOTOMY RIGHT PERCUTANEOUS WITH SURGEON ACCESS (Right) HOLMIUM LASER APPLICATION (Right) as a surgical intervention .  The patient's history has been reviewed, patient examined, no change in status, stable for surgery.  I have reviewed the patient's chart and labs.  Questions were answered to the patient's satisfaction.     Berniece Salines W

## 2014-08-13 NOTE — H&P (Signed)
Reason For Visit f/u for hematuria w/u   History of Present Illness 58F, patient of Dr. Clint GuyMacDiarmids who presents to clinic in follow-up after completing a hematuria work-up. Her cytoscopy was negative. Her CT scan showed bilateral stones. She has a 11mm stone within the renal pelvis on the right. She denies any right flank pain. She denies any fevers/chills. She has passed several stones in the past. She has had ongoing hematuria and states that she feels weak because of it. In particular, she had one episode this past weekend that was severe.  Patient has a history of 2 strokes in the past, she is no longer taking any anticoagulation for this.   Past Medical History Problems  1. History of High cholesterol (272.0) 2. History of esophageal reflux (V12.79) 3. History of hypertension (V12.59) 4. History of stomach ulcers (V12.79) 5. History of stroke (V12.54)  Surgical History Problems  1. History of Appendectomy 2. History of Oophorectomy 3. History of Salpingectomy 4. History of Skin Tag Removal 5. History of Small Bowel Resection 6. History of Total Abdominal Hysterectomy  Current Meds 1. Bystolic 10 MG Oral Tablet;  Therapy: (Recorded:08Sep2015) to Recorded 2. Diovan 160 MG Oral Tablet;  Therapy: (Recorded:08Sep2015) to Recorded 3. Diovan 80 MG Oral Tablet;  Therapy: (Recorded:08Sep2015) to Recorded 4. Gaviscon SUSP;  Therapy: (Recorded:08Sep2015) to Recorded 5. Lipitor 40 MG Oral Tablet;  Therapy: (Recorded:08Sep2015) to Recorded 6. Norvasc TABS;  Therapy: (Recorded:08Sep2015) to Recorded 7. Zantac 150 MG CAPS;  Therapy: (Recorded:08Sep2015) to Recorded  Allergies Medication  1. Penicillins 2. clindamycin  Family History Problems  1. Family history of hypertension (V17.49) : Father, Sister 2. Family history of kidney stones (V18.69) : Father 3. Family history of pancreatic cancer (V16.0) : Father 4. Family history of type 2 diabetes mellitus (V18.0) : Father,  Sister 5. Family history of Melanoma : Mother  Social History Problems  1. Alcohol use (V49.89)   2 glasses per month 2. Death in the family, father   age 59 due to pancreatic cancer 3. Death in the family, mother   age 59 due to melanoma 4. Former smoker (V15.82)   less than a pack a day for 2 years, quit 38 years ago as of 07/06/14 office visit 5. Occasional caffeine consumption  Review of Systems No changes in pts bowel habits, neurological changes, or progressive lower urinary tract symptoms.    Vitals Vital Signs [Data Includes: Last 1 Day]  Recorded: 15Sep2015 01:09PM  Height: 5 ft 3.75 in Weight: 150 lb  BMI Calculated: 25.95 BSA Calculated: 1.73 Blood Pressure: 197 / 99 Heart Rate: 71 Recorded: 15Sep2015 11:57AM  Blood Pressure: 193 / 97 Temperature: 97.9 F Heart Rate: 67  Physical Exam Constitutional: Well nourished and well developed . No acute distress.  ENT:. The ears and nose are normal in appearance.  Neck: The appearance of the neck is normal and no neck mass is present.  Cardiovascular: Heart rate and rhythm are normal . No peripheral edema.  Abdomen: The abdomen is soft and nontender. No masses are palpated. No CVA tenderness. No hernias are palpable. No hepatosplenomegaly noted.  Skin: Normal skin turgor, no visible rash and no visible skin lesions.  Neuro/Psych:. Mood and affect are appropriate.    Results/Data Urine [Data Includes: Last 1 Day]   15Sep2015  COLOR YELLOW   APPEARANCE CLEAR   SPECIFIC GRAVITY 1.020   pH 5.0   GLUCOSE NEG mg/dL  BILIRUBIN NEG   KETONE NEG mg/dL  BLOOD LARGE   PROTEIN  NEG mg/dL  UROBILINOGEN 0.2 mg/dL  NITRITE NEG   LEUKOCYTE ESTERASE NEG   SQUAMOUS EPITHELIAL/HPF FEW   WBC 0-2 WBC/hpf  RBC 7-10 RBC/hpf  BACTERIA FEW   CRYSTALS NONE SEEN   CASTS NONE SEEN    The following images/tracing/specimen were independently visualized: .   CT abdomen/pelvis  Bilateral renal calculi likely accounting for the  patient's  hematuria. The largest calculus measures 11 mm and this in the  midpole region of the right kidney.    No worrisome renal or bladder lesions.    No abdominal masses or adenopathy.    KUB: I obtained a KUB today because I was concerned about her stone being uric acid. The renal shadows are visible bilaterally, there is no visible stone within the expected position of this right UPJ as seen on the patient's CT scan. There are no additional calcifications within the expected trajectories of the ureters or the bladder.  The patient's urinalysis today demonstrates microscopic hematuria.     Assessment Assessed  1. Right ureteral calculus (592.1)  Patient likely has a uric acid stone within the right UPJ.   Plan Gross hematuria  1. KUB; Status:Complete;   Done: 15Sep2015 12:00AM Health Maintenance  2. Changed: From  Bystolic 10 MG Oral Tablet  To Bystolic 10 MG Oral Tablet TAKE 1  TABLET DAILY 3. Changed: From  Diovan 160 MG Oral Tablet  To Valsartan 160 MG Oral Tablet (Diovan)  TAKE 1 TABLET DAILY 4. Changed: From  Diovan 80 MG Oral Tablet  To Valsartan 80 MG Oral Tablet (Diovan)  TAKE 1 TABLET DAILY FOR BLOOD PRESSURE 5. UA With REFLEX; [Do Not Release]; Status:Complete;   Done: 15Sep2015 11:47AM Right ureteral calculus  6. Start: Tamsulosin HCl - 0.4 MG Oral Capsule; TAKE 1 CAPSULE Daily 7. Follow-up Schedule Surgery Office  Follow-up  Status: Hold For - Appointment   Requested for: 15Sep2015  Discussion/Summary We discussed treatment options including ureteroscopy, PCNL, and shockwave lithotripsy. She understands that shockwave lithotripsy would be difficult because of our inability to target the stone. However, if given contrast we may be able to target the stone a little easier. As such, we will plan to do this with the risk of failure as well as [place]. The patient is well aware of these risks and has agreed to proceed.   Addendum: ESWL was canceled on  the day of her surgery because she didn't have someone who could stay with her after the surgery.  We again addressed the options for management of this stone.  After great debate, she has decided to proceed with PCNL.  Again, I went over the risk/benefits.  She understands that this procedure comes with a higher risk of bleeding, damage to surround structures, and infection.  She will be hospitalized over night.

## 2014-08-14 ENCOUNTER — Observation Stay (HOSPITAL_COMMUNITY): Payer: BC Managed Care – PPO

## 2014-08-14 DIAGNOSIS — N200 Calculus of kidney: Secondary | ICD-10-CM | POA: Diagnosis not present

## 2014-08-14 LAB — CBC
HEMATOCRIT: 39.3 % (ref 36.0–46.0)
Hemoglobin: 12.8 g/dL (ref 12.0–15.0)
MCH: 29.4 pg (ref 26.0–34.0)
MCHC: 32.6 g/dL (ref 30.0–36.0)
MCV: 90.1 fL (ref 78.0–100.0)
Platelets: 227 10*3/uL (ref 150–400)
RBC: 4.36 MIL/uL (ref 3.87–5.11)
RDW: 13.5 % (ref 11.5–15.5)
WBC: 9.2 10*3/uL (ref 4.0–10.5)

## 2014-08-14 LAB — BASIC METABOLIC PANEL
Anion gap: 15 (ref 5–15)
BUN: 15 mg/dL (ref 6–23)
CALCIUM: 8.7 mg/dL (ref 8.4–10.5)
CO2: 22 meq/L (ref 19–32)
Chloride: 102 mEq/L (ref 96–112)
Creatinine, Ser: 1.09 mg/dL (ref 0.50–1.10)
GFR calc Af Amer: 63 mL/min — ABNORMAL LOW (ref >90)
GFR calc non Af Amer: 54 mL/min — ABNORMAL LOW (ref >90)
GLUCOSE: 157 mg/dL — AB (ref 70–99)
Potassium: 4.6 mEq/L (ref 3.7–5.3)
Sodium: 139 mEq/L (ref 137–147)

## 2014-08-14 MED ORDER — CIPROFLOXACIN HCL 500 MG PO TABS: 500.0000 mg | ORAL_TABLET | Freq: Two times a day (BID) | ORAL | Status: DC

## 2014-08-14 NOTE — Discharge Instructions (Signed)
Discharge instructions following PCNL  Call your doctor for: Fevers greater than 100.5 Severe nausea or vomiting Increasing pain not controlled by pain medication Increasing redness or drainage from incisions Decreased urine output or a catheter is no longer draining  The number for questions is [phone].  Activity: Gradually increase activity with short frequent walks, 3-4 times a day.  Avoid strenuous activities, like sports, lawn-mowing, or heavy lifting (more than 10-15 pounds).  Wear loose, comfortable clothing that pull or kink the tube or tubes.  Do not drive while taking pain medication, or until your doctor permitts it.  Bathing and dressing changes: You should not shower for 48 hours after surgery.  Do not soak your back in a bathtub.  Diet: It is extremely important to drink plenty of fluids after surgery, especially water.  You may resume your regular diet, unless otherwise instructed.  Medications: May take Tylenol (acetaminophen) or ibuprofen (Advil, Motrin) as directed over-the-counter. Trospium is to prevent bladder spasms and help reduce urinary frequency. Take Cipro one hour prior to removal of your stent.  Take any prescriptions as directed.  Follow-up appointments: Follow-up appointment will be scheduled with Dr. Marlou PorchHerrick in 10-14 days for hospital check and stent removal.   SIGNS/SYMPTOMS TO CALL:  Please call us if you have a fever greater than 101.5, uncontrolled nausea/vomiting, uncontrolled pain, dizziness, unable to urinate, bloody urine, chest pain, shortness of breath, leg swelling, leg pain, redness around wound, drainage from wound, or any other concerns or questions.   You can reach us at [phone].   Stent Removal You have a string attached to your stent, you may remove it on Friday, Oct. 23rd.  To do this, pull the strings until the stents are completely removed. You may feel an odd sensation in your back. Don't forget to take your Cipro 1  hour prior to removal.   Kidney Stones Kidney stones (urolithiasis) are solid masses that form inside your kidneys. The intense pain is caused by the stone moving through the kidney, ureter, bladder, and urethra (urinary tract). When the stone moves, the ureter starts to spasm around the stone. The stone is usually passed in your pee (urine).  HOME CARE  Drink enough fluids to keep your pee clear or pale yellow. This helps to get the stone out.  Strain all pee through the provided strainer. Do not pee without peeing through the strainer, not even once. If you pee the stone out, catch it in the strainer. The stone may be as small as a grain of salt. Take this to your doctor. This will help your doctor figure out what you can do to try to prevent more kidney stones.  Only take medicine as told by your doctor.  Follow up with your doctor as told.  Get follow-up X-rays as told by your doctor. GET HELP IF: You have pain that gets worse even if you have been taking pain medicine. GET HELP RIGHT AWAY IF:   Your pain does not get better with medicine.  You have a fever or shaking chills.  Your pain increases and gets worse over 18 hours.  You have new belly (abdominal) pain.  You feel faint or pass out.  You are unable to pee. MAKE SURE YOU:   Understand these instructions.  Will watch your condition.  Will get help right away if you are not doing well or get worse. Document Released: 04/02/2008 Document Revised: 06/17/2013 Document Reviewed: 03/18/2013 [employer] Patient Information 2015 [employer] [place]. This information  is not intended to replace advice given to you by your health care provider. Make sure you discuss any questions you have with your health care provider.

## 2014-08-14 NOTE — Progress Notes (Signed)
UR completed 

## 2014-08-14 NOTE — Progress Notes (Signed)
Urology Progress Note  1 Day Post-Op   Subjective: pt feels well,. No pain. Ready for d/c.  Labs refvioewed: normal KUB: JJ in good position. ++ gas     No acute urologic events overnight. Ambulation:   positive Flatus:    positive Bowel movement  negative  Pain: complete resolution  Objective:  Blood pressure 166/81, pulse 63, temperature 98.4 F (36.9 C), temperature source Oral, resp. rate 16, height 5\' 3"  (1.6 m), weight 73.1 kg (161 lb 2.5 oz), SpO2 95.00%.  Physical Exam:  General:  No acute distress, awake Extremities: Homans sign is negative, no sign of DVT Genitourinary:  Normal BUS Foley:out    I/O last 3 completed shifts: In: 3055 [I.V.:2755; IV Piggyback:300] Out: 1300 [Urine:1300]  Recent Labs     08/13/14  1824  08/14/14  0416  HGB  13.3  12.8  WBC  14.7*  9.2  PLT  255  227    Recent Labs     08/14/14  0416  NA  139  K  4.6  CL  102  CO2  22  BUN  15  CREATININE  1.09  CALCIUM  8.7  GFRNONAA  54*  GFRAA  63*   08/13/2014.  EXAM:  ABDOMEN - 1 VIEW  COMPARISON: 07/26/2014.  FINDINGS:  There is a right-sided double-J nephro ureteral stent in place.  Resolution of previous right renal calculus. Prominent air-filled  loops of large bowel are identified without evidence for small bowel  obstruction.  IMPRESSION:  1. Interval placement of right double-J nephro ureteral stent with  resolution of previous right renal calculus.  Electronically Signed  By: Signa Kell M.D.  On: 08/14/2014 10:45    Assessment/Plan: Stable post percutaneous nephrolithotomy. Will D/c this AM.   Follow up in per Dr. Marlou Porch

## 2014-08-16 ENCOUNTER — Encounter (HOSPITAL_COMMUNITY): Payer: Self-pay | Admitting: Urology

## 2014-08-24 NOTE — Discharge Summary (Signed)
Date of admission: 08/13/2014  Date of discharge: 08/24/2014  Admission diagnosis: right renal pelvis stone  Discharge diagnosis: same  Secondary diagnoses:  Patient Active Problem List   Diagnosis Date Noted  . Nephrolithiasis 08/13/2014  . Weakness of right leg 09/14/2012  . Transient ischemic attack (TIA), and cerebral infarction without residual deficits 01/11/2012  . Cerebral infarct 01/11/2012  . HTN (hypertension) 01/11/2012  . GERD (gastroesophageal reflux disease) 01/11/2012  . Hyperlipidemia LDL goal < 100 01/11/2012    History and Physical: For full details, please see admission history and physical. Briefly, Shari Taylor is a 58 y.o. year old patient with large right renal pelvis stone.   Hospital Course: Patient tolerated the procedure well.  She was then transferred to the floor after an uneventful PACU stay.  Her hospital course was uncomplicated.  On POD#1  she had met discharge criteria: was eating a regular diet, was up and ambulating independently,  pain was well controlled, was voiding without a catheter, and was ready to for discharge.   Laboratory values:  No results found for this basename: WBC, HGB, HCT,  in the last 72 hours No results found for this basename: NA, K, CL, CO2, GLUCOSE, BUN, CREATININE, CALCIUM,  in the last 72 hours No results found for this basename: LABPT, INR,  in the last 72 hours No results found for this basename: LABURIN,  in the last 72 hours Results for orders placed during the hospital encounter of 08/09/14  URINE CULTURE     Status: None   Collection Time    08/09/14 10:05 AM      Result Value Ref Range Status   Specimen Description URINE, CLEAN CATCH   Final   Special Requests Normal   Final   Culture  Setup Time     Final   Value: 08/09/2014 14:08     Performed at [facility]     Final   Value: NO GROWTH     Performed at [facility]   Culture     Final   Value: NO GROWTH   Performed at [facility]   Report Status 08/10/2014 FINAL   Final    Disposition: Home  Discharge instruction: The patient was instructed to be ambulatory but told to refrain from heavy lifting, strenuous activity, or driving.   Discharge medications:    Medication List    STOP taking these medications       tamsulosin 0.4 MG Caps capsule  Commonly known as:  FLOMAX      TAKE these medications       amLODipine 10 MG tablet  Commonly known as:  NORVASC  Take 10 mg by mouth every morning.     ciprofloxacin 500 MG tablet  Commonly known as:  CIPRO  Take 1 tablet (500 mg total) by mouth once.     ciprofloxacin 500 MG tablet  Commonly known as:  CIPRO  Take 1 tablet (500 mg total) by mouth 2 (two) times daily.     DSS 100 MG Caps  Take 100 mg by mouth 2 (two) times daily.     GAVISCON PO  Take 1 tablet by mouth every 6 (six) hours as needed (reflux). For indigestion/reflux     nebivolol 10 MG tablet  Commonly known as:  BYSTOLIC  Take 10 mg by mouth every morning.     oxyCODONE 5 MG immediate release tablet  Commonly known as:  Oxy IR/ROXICODONE  Take  1-2 tablets (5-10 mg total) by mouth every 4 (four) hours as needed for severe pain.     ranitidine 150 MG capsule  Commonly known as:  ZANTAC  Take 150 mg by mouth 2 (two) times daily as needed. For reflux     simvastatin 20 MG tablet  Commonly known as:  ZOCOR  Take 20 mg by mouth daily.     Trospium Chloride 60 MG Cp24  Take 1 capsule (60 mg total) by mouth daily.     valsartan 160 MG tablet  Commonly known as:  DIOVAN  Take 160 mg by mouth every morning.     valsartan 80 MG tablet  Commonly known as:  DIOVAN  Take 80 mg by mouth at bedtime.        Followup:      Follow-up Information   Follow up with Ardis Hughs, MD On 08/24/2014. (1pm)    Specialty:  Urology   Contact information:   [address] [place] [address] [phone]       Follow up with Ardis Hughs,  MD.   Specialty:  Urology   Contact information:   [place] [address] [phone]

## 2015-07-24 ENCOUNTER — Inpatient Hospital Stay (HOSPITAL_COMMUNITY)
Admission: EM | Admit: 2015-07-24 | Discharge: 2015-07-25 | DRG: 309 | Disposition: A | Discharge status: Home / Self Care | Payer: BLUE CROSS/BLUE SHIELD | Attending: Internal Medicine | Admitting: Internal Medicine

## 2015-07-24 ENCOUNTER — Encounter (HOSPITAL_COMMUNITY): Payer: Self-pay

## 2015-07-24 ENCOUNTER — Emergency Department (HOSPITAL_COMMUNITY): Payer: BLUE CROSS/BLUE SHIELD

## 2015-07-24 DIAGNOSIS — Z880 Allergy status to penicillin: Secondary | ICD-10-CM | POA: Diagnosis not present

## 2015-07-24 DIAGNOSIS — Z90710 Acquired absence of both cervix and uterus: Secondary | ICD-10-CM

## 2015-07-24 DIAGNOSIS — Z9049 Acquired absence of other specified parts of digestive tract: Secondary | ICD-10-CM | POA: Diagnosis present

## 2015-07-24 DIAGNOSIS — F419 Anxiety disorder, unspecified: Secondary | ICD-10-CM | POA: Diagnosis present

## 2015-07-24 DIAGNOSIS — K219 Gastro-esophageal reflux disease without esophagitis: Secondary | ICD-10-CM | POA: Diagnosis present

## 2015-07-24 DIAGNOSIS — I639 Cerebral infarction, unspecified: Secondary | ICD-10-CM | POA: Diagnosis present

## 2015-07-24 DIAGNOSIS — E780 Pure hypercholesterolemia: Secondary | ICD-10-CM | POA: Diagnosis present

## 2015-07-24 DIAGNOSIS — I493 Ventricular premature depolarization: Secondary | ICD-10-CM | POA: Diagnosis present

## 2015-07-24 DIAGNOSIS — Z881 Allergy status to other antibiotic agents status: Secondary | ICD-10-CM | POA: Diagnosis not present

## 2015-07-24 DIAGNOSIS — Z23 Encounter for immunization: Secondary | ICD-10-CM

## 2015-07-24 DIAGNOSIS — E785 Hyperlipidemia, unspecified: Secondary | ICD-10-CM | POA: Diagnosis not present

## 2015-07-24 DIAGNOSIS — K222 Esophageal obstruction: Secondary | ICD-10-CM | POA: Diagnosis present

## 2015-07-24 DIAGNOSIS — Z79899 Other long term (current) drug therapy: Secondary | ICD-10-CM | POA: Diagnosis not present

## 2015-07-24 DIAGNOSIS — K449 Diaphragmatic hernia without obstruction or gangrene: Secondary | ICD-10-CM | POA: Diagnosis present

## 2015-07-24 DIAGNOSIS — Z8249 Family history of ischemic heart disease and other diseases of the circulatory system: Secondary | ICD-10-CM

## 2015-07-24 DIAGNOSIS — Z87442 Personal history of urinary calculi: Secondary | ICD-10-CM

## 2015-07-24 DIAGNOSIS — I10 Essential (primary) hypertension: Secondary | ICD-10-CM | POA: Diagnosis present

## 2015-07-24 DIAGNOSIS — R002 Palpitations: Secondary | ICD-10-CM

## 2015-07-24 DIAGNOSIS — Z8673 Personal history of transient ischemic attack (TIA), and cerebral infarction without residual deficits: Secondary | ICD-10-CM | POA: Diagnosis not present

## 2015-07-24 DIAGNOSIS — R0789 Other chest pain: Secondary | ICD-10-CM | POA: Diagnosis not present

## 2015-07-24 DIAGNOSIS — Z8711 Personal history of peptic ulcer disease: Secondary | ICD-10-CM

## 2015-07-24 DIAGNOSIS — K5090 Crohn's disease, unspecified, without complications: Secondary | ICD-10-CM | POA: Diagnosis present

## 2015-07-24 DIAGNOSIS — Z8371 Family history of colonic polyps: Secondary | ICD-10-CM

## 2015-07-24 DIAGNOSIS — I340 Nonrheumatic mitral (valve) insufficiency: Secondary | ICD-10-CM | POA: Diagnosis not present

## 2015-07-24 DIAGNOSIS — Z91018 Allergy to other foods: Secondary | ICD-10-CM

## 2015-07-24 DIAGNOSIS — Z90721 Acquired absence of ovaries, unilateral: Secondary | ICD-10-CM | POA: Diagnosis present

## 2015-07-24 DIAGNOSIS — R9081 Abnormal echoencephalogram: Secondary | ICD-10-CM | POA: Insufficient documentation

## 2015-07-24 DIAGNOSIS — R079 Chest pain, unspecified: Secondary | ICD-10-CM

## 2015-07-24 HISTORY — DX: Gastrointestinal hemorrhage, unspecified: K92.2

## 2015-07-24 HISTORY — DX: Peptic ulcer, site unspecified, unspecified as acute or chronic, without hemorrhage or perforation: K27.9

## 2015-07-24 LAB — BASIC METABOLIC PANEL
Anion gap: 11 (ref 5–15)
BUN: 14 mg/dL (ref 6–20)
CHLORIDE: 103 mmol/L (ref 101–111)
CO2: 22 mmol/L (ref 22–32)
CREATININE: 0.75 mg/dL (ref 0.44–1.00)
Calcium: 9.1 mg/dL (ref 8.9–10.3)
GFR calc non Af Amer: >60 mL/min (ref >60)
GLUCOSE: 151 mg/dL — AB (ref 65–99)
Potassium: 3.6 mmol/L (ref 3.5–5.1)
Sodium: 136 mmol/L (ref 135–145)

## 2015-07-24 LAB — CBC WITH DIFFERENTIAL/PLATELET
BASOS PCT: 1 %
Basophils Absolute: 0.1 10*3/uL (ref 0.0–0.1)
Eosinophils Absolute: 0.2 10*3/uL (ref 0.0–0.7)
Eosinophils Relative: 3 %
HEMATOCRIT: 43.1 % (ref 36.0–46.0)
HEMOGLOBIN: 14 g/dL (ref 12.0–15.0)
LYMPHS ABS: 2.6 10*3/uL (ref 0.7–4.0)
LYMPHS PCT: 27 %
MCH: 28.7 pg (ref 26.0–34.0)
MCHC: 32.5 g/dL (ref 30.0–36.0)
MCV: 88.5 fL (ref 78.0–100.0)
MONO ABS: 0.4 10*3/uL (ref 0.1–1.0)
Monocytes Relative: 5 %
NEUTROS PCT: 64 %
Neutro Abs: 6.2 10*3/uL (ref 1.7–7.7)
Platelets: 230 10*3/uL (ref 150–400)
RBC: 4.87 MIL/uL (ref 3.87–5.11)
RDW: 13.5 % (ref 11.5–15.5)
WBC: 9.5 10*3/uL (ref 4.0–10.5)

## 2015-07-24 LAB — RAPID URINE DRUG SCREEN, HOSP PERFORMED
AMPHETAMINES: NOT DETECTED
BARBITURATES: NOT DETECTED
BENZODIAZEPINES: NOT DETECTED
COCAINE: NOT DETECTED
OPIATES: NOT DETECTED
TETRAHYDROCANNABINOL: NOT DETECTED

## 2015-07-24 LAB — I-STAT TROPONIN, ED: TROPONIN I, POC: 0 ng/mL (ref 0.00–0.08)

## 2015-07-24 LAB — MAGNESIUM: MAGNESIUM: 2 mg/dL (ref 1.7–2.4)

## 2015-07-24 LAB — TROPONIN I
Troponin I: <0.03 ng/mL (ref <0.031)
Troponin I: <0.03 ng/mL (ref <0.031)

## 2015-07-24 LAB — LIPID PANEL
Cholesterol: 259 mg/dL — ABNORMAL HIGH (ref 0–200)
HDL: 38 mg/dL — AB (ref >40)
LDL CALC: 184 mg/dL — AB (ref 0–99)
TRIGLYCERIDES: 184 mg/dL — AB (ref <150)
Total CHOL/HDL Ratio: 6.8 RATIO
VLDL: 37 mg/dL (ref 0–40)

## 2015-07-24 LAB — PROTIME-INR
INR: 1.1 (ref 0.00–1.49)
Prothrombin Time: 14.4 seconds (ref 11.6–15.2)

## 2015-07-24 LAB — TSH: TSH: 4.062 u[IU]/mL (ref 0.350–4.500)

## 2015-07-24 LAB — T4, FREE: Free T4: 1.03 ng/dL (ref 0.61–1.12)

## 2015-07-24 LAB — APTT: aPTT: 29 seconds (ref 24–37)

## 2015-07-24 LAB — HIV ANTIBODY (ROUTINE TESTING W REFLEX): HIV Screen 4th Generation wRfx: NONREACTIVE

## 2015-07-24 LAB — MRSA PCR SCREENING: MRSA BY PCR: NEGATIVE

## 2015-07-24 LAB — GLUCOSE, CAPILLARY: Glucose-Capillary: 109 mg/dL — ABNORMAL HIGH (ref 65–99)

## 2015-07-24 MED ORDER — ALUM HYDROXIDE-MAG CARBONATE 95-358 MG/15ML PO SUSP
15.0000 mL | Freq: Four times a day (QID) | ORAL | Status: DC | PRN
  Filled 2015-07-24: qty 15

## 2015-07-24 MED ORDER — AMLODIPINE BESYLATE 10 MG PO TABS
10.0000 mg | ORAL_TABLET | Freq: Every morning | ORAL | Status: DC
  Administered 2015-07-24 – 2015-07-25 (×2): 10 mg via ORAL
  Filled 2015-07-24 (×2): qty 1

## 2015-07-24 MED ORDER — IRBESARTAN 75 MG PO TABS
75.0000 mg | ORAL_TABLET | Freq: Every morning | ORAL | Status: DC
  Administered 2015-07-24 – 2015-07-25 (×2): 75 mg via ORAL
  Filled 2015-07-24 (×2): qty 1

## 2015-07-24 MED ORDER — IRBESARTAN 150 MG PO TABS
150.0000 mg | ORAL_TABLET | Freq: Every day | ORAL | Status: DC
  Administered 2015-07-24: 150 mg via ORAL
  Filled 2015-07-24 (×2): qty 1

## 2015-07-24 MED ORDER — MORPHINE SULFATE (PF) 2 MG/ML IV SOLN: 2.0000 mg | INTRAVENOUS | Status: DC | PRN

## 2015-07-24 MED ORDER — ASPIRIN 325 MG PO TABS: 325.0000 mg | ORAL_TABLET | Freq: Once | ORAL | Status: DC

## 2015-07-24 MED ORDER — NEBIVOLOL HCL 5 MG PO TABS
5.0000 mg | ORAL_TABLET | Freq: Every morning | ORAL | Status: DC
  Administered 2015-07-24 – 2015-07-25 (×2): 5 mg via ORAL
  Filled 2015-07-24 (×3): qty 1

## 2015-07-24 MED ORDER — HEPARIN SODIUM (PORCINE) 5000 UNIT/ML IJ SOLN
5000.0000 [IU] | Freq: Three times a day (TID) | INTRAMUSCULAR | Status: DC
  Filled 2015-07-24 (×3): qty 1

## 2015-07-24 MED ORDER — ONDANSETRON HCL 4 MG PO TABS: 4.0000 mg | ORAL_TABLET | Freq: Four times a day (QID) | ORAL | Status: DC | PRN

## 2015-07-24 MED ORDER — SODIUM CHLORIDE 0.9 % IV BOLUS (SEPSIS)
1000.0000 mL | Freq: Once | INTRAVENOUS | Status: AC
  Administered 2015-07-24: 1000 mL via INTRAVENOUS

## 2015-07-24 MED ORDER — HYDRALAZINE HCL 20 MG/ML IJ SOLN
5.0000 mg | INTRAMUSCULAR | Status: DC | PRN
Start: 2015-07-24 — End: 2015-07-25

## 2015-07-24 MED ORDER — ALPRAZOLAM 0.25 MG PO TABS: 0.2500 mg | ORAL_TABLET | Freq: Every evening | ORAL | Status: DC | PRN

## 2015-07-24 MED ORDER — ACETAMINOPHEN 650 MG RE SUPP: 650.0000 mg | Freq: Four times a day (QID) | RECTAL | Status: DC | PRN

## 2015-07-24 MED ORDER — IRBESARTAN 150 MG PO TABS
150.0000 mg | ORAL_TABLET | Freq: Every day | ORAL | Status: DC
  Filled 2015-07-24: qty 1

## 2015-07-24 MED ORDER — ONDANSETRON HCL 4 MG/2ML IJ SOLN: 4.0000 mg | Freq: Four times a day (QID) | INTRAMUSCULAR | Status: DC | PRN

## 2015-07-24 MED ORDER — ATORVASTATIN CALCIUM 80 MG PO TABS
80.0000 mg | ORAL_TABLET | Freq: Every day | ORAL | Status: DC
  Administered 2015-07-24: 80 mg via ORAL
  Filled 2015-07-24 (×2): qty 1

## 2015-07-24 MED ORDER — SODIUM CHLORIDE 0.9 % IJ SOLN
3.0000 mL | Freq: Two times a day (BID) | INTRAMUSCULAR | Status: DC
  Administered 2015-07-24 – 2015-07-25 (×2): 3 mL via INTRAVENOUS

## 2015-07-24 MED ORDER — ACETAMINOPHEN 325 MG PO TABS: 650.0000 mg | ORAL_TABLET | Freq: Four times a day (QID) | ORAL | Status: DC | PRN

## 2015-07-24 MED ORDER — INFLUENZA VAC SPLIT QUAD 0.5 ML IM SUSY
0.5000 mL | PREFILLED_SYRINGE | INTRAMUSCULAR | Status: AC
  Administered 2015-07-25: 0.5 mL via INTRAMUSCULAR
  Filled 2015-07-24: qty 0.5

## 2015-07-24 MED ORDER — POTASSIUM CHLORIDE CRYS ER 20 MEQ PO TBCR
40.0000 meq | EXTENDED_RELEASE_TABLET | Freq: Once | ORAL | Status: AC
  Administered 2015-07-24: 40 meq via ORAL
  Filled 2015-07-24: qty 2

## 2015-07-24 MED ORDER — IRBESARTAN 150 MG PO TABS: 150.0000 mg | ORAL_TABLET | Freq: Every day | ORAL | Status: DC

## 2015-07-24 MED ORDER — SODIUM CHLORIDE 0.9 % IV SOLN
INTRAVENOUS | Status: DC
  Administered 2015-07-24: 75 mL/h via INTRAVENOUS

## 2015-07-24 MED ORDER — FAMOTIDINE 20 MG PO TABS
20.0000 mg | ORAL_TABLET | Freq: Two times a day (BID) | ORAL | Status: DC
Start: 2015-07-24 — End: 2015-07-25
  Filled 2015-07-24 (×5): qty 1

## 2015-07-24 MED ORDER — NITROGLYCERIN 0.4 MG SL SUBL: 0.4000 mg | SUBLINGUAL_TABLET | SUBLINGUAL | Status: DC | PRN

## 2015-07-24 MED ORDER — ALUM HYDROXIDE-MAG TRISILICATE 80-20 MG PO CHEW
1.0000 | CHEWABLE_TABLET | Freq: Four times a day (QID) | ORAL | Status: DC | PRN
  Administered 2015-07-24 (×2): 1 via ORAL
  Filled 2015-07-24 (×4): qty 1

## 2015-07-24 NOTE — ED Notes (Signed)
Pt states that when she lays flat she starts to feel like she's going to pass out. Pt is stating every time that she has a pvc that she is having the hollow feeling in her chest and it is consistent with the monitor.

## 2015-07-24 NOTE — ED Provider Notes (Signed)
CSN: [id number]     Arrival date & time 07/24/15  0204 History  This chart was scribed for Tomasita Crumble, MD by Freida Busman, ED Scribe. This patient was seen in room B19C/B19C and the patient's care was started 2:20 AM.    Chief Complaint  Patient presents with  . Chest Pain   The history is provided by the patient. No language interpreter was used.     HPI Comments:  Shari Taylor is a 59 y.o. female with a history of HTN who presents to the Emergency Department complaining of palpitations that started last night when she was getting ready for bed. She  feels like her heart is beating irregularly. Pt denies h/o same. Pt reports associated nausea and  "hollow" feeling in her chest but denies pain. She also denies cardiac history, recent sickness, SOB and vomiting. Pt notes laying flat exacerbates symptoms. No alleviating factors noted.   Past Medical History  Diagnosis Date  . Hypertension   . GERD (gastroesophageal reflux disease)   . Crohn disease     "positive blood test-maybe no pathology, signs and symptoms present"  . H/O hiatal hernia   . Schatzki's ring   . Impacted cerumen   . Stroke 01/2012, 08/2012    x2  . Hypercholesteremia   . Pneumonia 1998    hx of  . History of kidney stones   . History of blood transfusion 2006    x6, no problems  . PONV (postoperative nausea and vomiting)     fentanyl   Past Surgical History  Procedure Laterality Date  . Appendectomy  1976  . Total abdominal hysterectomy  1977  . Bowel resection  2006    colonoscopy-bowel coil in IR to control bleeding-bowel died-resection  . Salpingoophorectomy Right 1978  . Colonoscopy      polyp removal led to GI bleed and ICU stay (2006)  . Esophagogastroduodenoscopy    . Tee without cardioversion  09/17/2012    Procedure: TRANSESOPHAGEAL ECHOCARDIOGRAM (TEE);  Surgeon: Lewayne Bunting, MD;  Location: [facility] ENDOSCOPY;  Service: Cardiovascular;  Laterality: N/A;  . Nephrolithotomy Right  08/13/2014    Procedure: NEPHROLITHOTOMY RIGHT PERCUTANEOUS WITH SURGEON ACCESS, double J stent;  Surgeon: Crist Fat, MD;  Location: [facility] ORS;  Service: Urology;  Laterality: Right;   Family History  Problem Relation Age of Onset  . Colon polyps Father   . Pancreatic cancer Father   . Melanoma Mother   . Hearing loss Brother   . Obesity Sister   . Hypertension Sister   . Diabetes type II Sister    Social History  Substance Use Topics  . Smoking status: Never Smoker   . Smokeless tobacco: Never Used  . Alcohol Use: Yes     Comment: 1-2 glasses per month   OB History    No data available     Review of Systems  A complete 10 system review of systems was obtained and all systems are negative except as noted in the HPI and PMH.    Allergies  Penicillins; Beef-derived products; Clindamycin/lincomycin; and Fentanyl  Home Medications   Prior to Admission medications   Medication Sig Start Date End Date Taking? Authorizing Provider  Alum Hydroxide-Mag Carbonate (GAVISCON PO) Take 1 tablet by mouth every 6 (six) hours as needed (reflux). For indigestion/reflux    Historical Provider, MD  amLODipine (NORVASC) 10 MG tablet Take 10 mg by mouth every morning.     Historical Provider, MD  ciprofloxacin (CIPRO) 500  MG tablet Take 1 tablet (500 mg total) by mouth once. 08/13/14   Crist Fat, MD  ciprofloxacin (CIPRO) 500 MG tablet Take 1 tablet (500 mg total) by mouth 2 (two) times daily. 08/14/14   Jethro Bolus, MD  docusate sodium 100 MG CAPS Take 100 mg by mouth 2 (two) times daily. 08/13/14   Crist Fat, MD  nebivolol (BYSTOLIC) 10 MG tablet Take 10 mg by mouth every morning.    Historical Provider, MD  oxyCODONE (OXY IR/ROXICODONE) 5 MG immediate release tablet Take 1-2 tablets (5-10 mg total) by mouth every 4 (four) hours as needed for severe pain. 08/13/14   Crist Fat, MD  ranitidine (ZANTAC) 150 MG capsule Take 150 mg by mouth 2 (two) times  daily as needed. For reflux    Historical Provider, MD  simvastatin (ZOCOR) 20 MG tablet Take 20 mg by mouth daily.    Historical Provider, MD  Trospium Chloride 60 MG CP24 Take 1 capsule (60 mg total) by mouth daily. 08/13/14   Crist Fat, MD  valsartan (DIOVAN) 160 MG tablet Take 160 mg by mouth every morning.     Historical Provider, MD  valsartan (DIOVAN) 80 MG tablet Take 80 mg by mouth at bedtime.    Historical Provider, MD   BP 170/90 mmHg  Pulse 79  Resp 13  Ht  (1.6 m)  Wt 160 lb (72.576 kg)  BMI 28.35 kg/m2  SpO2 96% Physical Exam  Constitutional: She is oriented to person, place, and time. She appears well-developed and well-nourished. No distress.  HENT:  Head: Normocephalic and atraumatic.  Nose: Nose normal.  Mouth/Throat: Oropharynx is clear and moist. No oropharyngeal exudate.  Eyes: Conjunctivae and EOM are normal. Pupils are equal, round, and reactive to light. No scleral icterus.  Neck: Normal range of motion. Neck supple. No JVD present. No tracheal deviation present. No thyromegaly present.  Cardiovascular: Normal rate, regular rhythm and normal heart sounds.  Exam reveals no gallop and no friction rub.   No murmur heard. Pulmonary/Chest: Effort normal and breath sounds normal. No respiratory distress. She has no wheezes. She exhibits no tenderness.  Abdominal: Soft. Bowel sounds are normal. She exhibits no distension and no mass. There is no tenderness. There is no rebound and no guarding.  Musculoskeletal: Normal range of motion. She exhibits no edema or tenderness.  Lymphadenopathy:    She has no cervical adenopathy.  Neurological: She is alert and oriented to person, place, and time. No cranial nerve deficit. She exhibits normal muscle tone.  Skin: Skin is warm and dry. No rash noted. No erythema. No pallor.  Nursing note and vitals reviewed.   ED Course  Procedures   DIAGNOSTIC STUDIES:  Oxygen Saturation is 96% on RA, normal by my  interpretation.    COORDINATION OF CARE:  2:23 AM Discussed treatment plan with pt at bedside and pt agreed to plan.  Labs Review Labs Reviewed  BASIC METABOLIC PANEL - Abnormal; Notable for the following:    Glucose, Bld 151 (*)    All other components within normal limits  CBC WITH DIFFERENTIAL/PLATELET  MAGNESIUM  TSH  T4, FREE  TROPONIN I  TROPONIN I  TROPONIN I  URINE RAPID DRUG SCREEN, HOSP PERFORMED  HIV ANTIBODY (ROUTINE TESTING)  HEMOGLOBIN A1C  LIPID PANEL  PROTIME-INR  APTT  I-STAT TROPOININ, ED    Imaging Review Dg Chest 2 View  07/24/2015   CLINICAL DATA:  Chest pain. Irregular heartbeat. Hypertension. Nonsmoker. History  previous stroke.  EXAM: CHEST  2 VIEW  COMPARISON:  05/31/2014  FINDINGS: Normal heart size and pulmonary vascularity. Linear scarring in the lung bases similar to previous study. No focal airspace disease or consolidation in the lungs. No blunting of costophrenic angles. No pneumothorax. Mediastinal contours appear intact. Degenerative changes in the spine.  IMPRESSION: No active cardiopulmonary disease.   Electronically Signed   By: Burman Nieves M.D.   On: 07/24/2015 03:40   I have personally reviewed and evaluated these images and lab results as part of my medical decision-making.   EKG Interpretation   Date/Time:  Sunday July 24 2015 05:58:35 EDT Ventricular Rate:  73 PR Interval:  149 QRS Duration: 84 QT Interval:  431 QTC Calculation: 475 R Axis:   10 Text Interpretation:  Sinus rhythm Abnormal R-wave progression, early  transition Artifact Inferior infarct, old Probable anterolateral infarct,  old No significant change since last tracing Confirmed by Erroll Luna [id number]) on 07/24/2015 6:37:51 AM      MDM   Final diagnoses:  None    Patient presents emergency department for chest pain. Her history is concerning for ACS. Troponin is negative, EKG shows new Q waves in the inferior leads as well as early  transition in the anterior leads. Patient was admitted to [facility] for further evaluation. I spoke with Dr. Clyde Lundborg who accepts the patient to step down. She was ordered aspirin and nitroglycerin.  I personally performed the services described in this documentation, which was scribed in my presence. The recorded information has been reviewed and is accurate.   Tomasita Crumble, MD 07/24/15 [phone]

## 2015-07-24 NOTE — Consult Note (Signed)
CARDIOLOGY CONSULT NOTE   Patient ID: Shari Taylor MRN: [id number], DOB/AGE: Jan 20, 1956   Admit date: 07/24/2015 Date of Consult: 07/24/2015   Primary Physician: Delphia Grates Primary Cardiologist: new  Pt. Profile  pleasant 59 year old Caucasian female with past medical history of HTN, HLD, hiatal hernia, Schatzki's ring, history of stroke, PUD and GI bleed came in with chest discomfort lasting a few seconds at a time  Problem List  Past Medical History  Diagnosis Date  . Hypertension   . GERD (gastroesophageal reflux disease)   . Crohn disease     "positive blood test-maybe no pathology, signs and symptoms present"  . H/O hiatal hernia   . Schatzki's ring   . Impacted cerumen   . Stroke 01/2012, 08/2012    x2  . Hypercholesteremia   . Pneumonia 1998    hx of  . History of kidney stones   . History of blood transfusion 2006    x6, no problems  . PONV (postoperative nausea and vomiting)     fentanyl  . PUD (peptic ulcer disease)   . GI bleeding     Past Surgical History  Procedure Laterality Date  . Appendectomy  1976  . Total abdominal hysterectomy  1977  . Bowel resection  2006    colonoscopy-bowel coil in IR to control bleeding-bowel died-resection  . Salpingoophorectomy Right 1978  . Colonoscopy      polyp removal led to GI bleed and ICU stay (2006)  . Esophagogastroduodenoscopy    . Tee without cardioversion  09/17/2012    Procedure: TRANSESOPHAGEAL ECHOCARDIOGRAM (TEE);  Surgeon: Lewayne Bunting, MD;  Location: [facility] ENDOSCOPY;  Service: Cardiovascular;  Laterality: N/A;  . Nephrolithotomy Right 08/13/2014    Procedure: NEPHROLITHOTOMY RIGHT PERCUTANEOUS WITH SURGEON ACCESS, double J stent;  Surgeon: Crist Fat, MD;  Location: [facility] ORS;  Service: Urology;  Laterality: Right;     Allergies  Allergies  Allergen Reactions  . Penicillins Anaphylaxis  . Beef-Derived Products Other (See Comments)    Patient does not eat beef at all  .  Clindamycin/Lincomycin     Intestinal bleeding  . Fentanyl Nausea And Vomiting    Patient refuses     HPI   The patient is a pleasant 59 year old Caucasian female with past medical history of HTN, HLD, hiatal hernia, Schatzki's ring, history of stroke, PUD and GI bleed. According to the patient, she had a colonoscopy with polyp removal in 2006 which resulted in significant bleeding, she subsequently had part of her colon taken out. She had previous endoscopy as well which showed peptic ulcer disease likely related to chronic aspirin use, and she has been avoiding aspirin since. She had 2 separate CVA in 2013 With brainstem CVA in March 2013 and left basal ganglia and posterior limb internal capsule acute infarct in November 2013. TEE obtained on 09/17/2012 showed EF 55-60%, no LV thrombus noted, no evidence of vegetation. She was discharged on a 30 day event monitor back in 2013, however she did not finish it. She was discharged on Plavix only. She took Plavix for the next 6-9 months before coming off of it. She has not had a stroke since. She reportedly had a treadmill stress test at the time but I do not see the record in our system. She was last admitted for renal stone in 2015, and has not had problems since.  According to the patient, she has been doing well since 2015. She denies any recent decline in her functional status.  She ambulated almost on daily basis and walked up to 2 miles on Wednesday without significant chest discomfort or shortness breath. On the night of 07/23/2015, she was ready to go to the patient that around midnight when she started having palpitations and "hollow/vacuum" feeling in the chest, she denies any obvious chest pain. The episode lasted a few seconds at a time, and appears to be worse when she tried to lay flat. She feared that if she were to lay flat, she would pass out. This prompted her to seek medical attention at [facility]. On EMS arrival, her systolic  blood pressure was over 200.   She was admitted to the internal medicine service. Overnight, serial troponin was negative. Chest x-ray was negative for acute process. Urine drug test was negative. EKG showed more prominent Q waves in the inferior and lateral leads when compared to the previous EKG. Cardiology has been consulted for chest discomfort.   Of note, although she does take Bystolic at home, she does not take it every day, she also did not take it yesterday. After admission, nursing staff noted she would have the same "hollow" feeling in the chest whenever there is PVC on telemetry.    Inpatient Medications  . amLODipine  10 mg Oral q morning - 10a  . atorvastatin  80 mg Oral q1800  . famotidine  20 mg Oral BID  . heparin  5,000 Units Subcutaneous 3 times per day  . [START ON 07/25/2015] Influenza vac split quadrivalent PF  0.5 mL Intramuscular Tomorrow-1000  . [START ON 07/25/2015] irbesartan  150 mg Oral QHS  . irbesartan  75 mg Oral q morning - 10a  . nebivolol  5 mg Oral q morning - 10a  . sodium chloride  3 mL Intravenous Q12H    Family History Family History  Problem Relation Age of Onset  . Colon polyps Father   . Pancreatic cancer Father   . Melanoma Mother   . Hearing loss Brother   . Obesity Sister   . Hypertension Sister   . Diabetes type II Sister      Social History Social History   Social History  . Marital Status: Legally Separated    Spouse Name: N/A  . Number of Children: N/A  . Years of Education: N/A   Occupational History  . Not on file.   Social History Main Topics  . Smoking status: Never Smoker   . Smokeless tobacco: Never Used  . Alcohol Use: Yes     Comment: 1-2 glasses per month  . Drug Use: No  . Sexual Activity: Not on file   Other Topics Concern  . Not on file   Social History Narrative     Review of Systems  General:  No chills, fever, night sweats or weight changes.  Cardiovascular:  No chest pain, dyspnea on exertion,  edema, orthopnea, paroxysmal nocturnal dyspnea. "hollow" feeling and palpitation in the chest Dermatological: No rash, lesions/masses Respiratory: No cough, dyspnea Urologic: No hematuria, dysuria Abdominal:   No nausea, vomiting, diarrhea, bright red blood per rectum, melena, or hematemesis Neurologic:  No visual changes, wkns, changes in mental status. +feeling of passing out All other systems reviewed and are otherwise negative except as noted above.  Physical Exam  Blood pressure 155/84, pulse 67, temperature 98 F (36.7 C), temperature source Oral, resp. rate 16, height  (1.6 m), weight 159 lb 6.3 oz (72.3 kg), SpO2 98 %.  General: Pleasant, NAD Psych: Normal affect.  Neuro: Alert and oriented X 3. Moves all extremities spontaneously. HEENT: Normal  Neck: Supple without bruits or JVD. Lungs:  Resp regular and unlabored, CTA. Heart: RRR no s3, s4, or murmurs. Abdomen: Soft, non-tender, non-distended, BS + x 4.  Extremities: No clubbing, cyanosis or edema. DP/PT/Radials 2+ and equal bilaterally.  Labs   Recent Labs  07/24/15 0559  TROPONINI <0.03   Lab Results  Component Value Date   WBC 9.5 07/24/2015   HGB 14.0 07/24/2015   HCT 43.1 07/24/2015   MCV 88.5 07/24/2015   PLT 230 07/24/2015     Recent Labs Lab 07/24/15 0255  NA 136  K 3.6  CL 103  CO2 22  BUN 14  CREATININE 0.75  CALCIUM 9.1  GLUCOSE 151*   Lab Results  Component Value Date   CHOL 259* 07/24/2015   HDL 38* 07/24/2015   LDLCALC 184* 07/24/2015   TRIG 184* 07/24/2015   No results found for: DDIMER  Radiology/Studies  Dg Chest 2 View  07/24/2015   CLINICAL DATA:  Chest pain. Irregular heartbeat. Hypertension. Nonsmoker. History previous stroke.  EXAM: CHEST  2 VIEW  COMPARISON:  05/31/2014  FINDINGS: Normal heart size and pulmonary vascularity. Linear scarring in the lung bases similar to previous study. No focal airspace disease or consolidation in the lungs. No blunting of  costophrenic angles. No pneumothorax. Mediastinal contours appear intact. Degenerative changes in the spine.  IMPRESSION: No active cardiopulmonary disease.   Electronically Signed   By: Burman Nieves M.D.   On: 07/24/2015 03:40    ECG  normal sinus rhythm with poor progression in anterior leads, Q waves present in the inferior and lateral leads.  ASSESSMENT AND PLAN  1. Atypical chest discomfort likely related to symptomatic PVCs and hypertensive urgency  - she missed her bystolic yesterday which would have helped with her symptom, it has been restarted  - obtain Echo. EKG does have more prominent Q waves in inferolateral leads however symptom does not suggest ACS  - not sure why she was having more symptom when laying down, ?timing of PVCs, I laid her flat in the room, no reproducible sympto  2. Hypertensive urgency  - SBP >200 by EMS, per pt, she does not take her BP med everyday  - with restart of medication, her BP hopefully will be better controlled  3. HTN 4. HLD 5. hiatal hernia/schatzki's ring 6. history of stroke 7. PUD  8. GI bleed after polypectomy s/p partial colon removal in 2006  Signed, Meng, Hao, [place] 07/24/2015, 12:01 PM   Patient seen and discussed with PA Lisabeth Devoid, I agree with his documentation. 59 yo female history of HTN, crohns disease, HL, GERD, PUD, GI leed admitted with palpitations. Symptoms started last night around midnight. Feeling of heart skipping. She felt lightheaded like she was going to pass out. Denies any chest pain, but describes a nonspecific "hollow" feeling left chest that lasted just a second. The palpitations lasted several hours. From her report and hospital notes her symptoms were noted while in EMS and in ER when she was having PVCs on the monitor. She denies any heavy caffeine or EtOH use. She is on bystolic but has not taken for last few days. She currently has not symptoms. Floor telemetry reviewed and shows isolated PVCs, do not have  strips from EMS or ER.   TSH 4, Mg 2, K 3.6, Cr 0.75, Hgb 14, Plt 230, trop neg x2, LDL 184, UDS neg Echo pending CXR no acute  process EKG SR, inferior and anterolateral Qwaves  Symptomatic palpitations, it seems they may correlate with PVCs. No chest pain, no evidence of ACS. She does have some new Qwaves on EKG, will f/u echo to see if corresponds with wall motion abnoramlity. Do not see indication for stress testing at this time. Monitor on telemetry today, encouraged compliance with her beta blocker. Likely discharge tomorrow, if recurrent outpatient symptoms on bystolic would consider outpatient monitor at that time.    Dominga Ferry MD

## 2015-07-24 NOTE — Progress Notes (Signed)
Patient Demographics:    Shari Taylor, is a 59 y.o. female, DOB - 26-Feb-1956, BJS:[id number]  Admit date - 07/24/2015   Admitting Physician Lorretta Harp, MD  Outpatient Primary MD for the patient is [name],[name], PA-C  LOS - 0   Chief Complaint  Patient presents with  . Chest Pain        Subjective:    Kree Neujahr today has, No headache, No chest pain, No abdominal pain - No Nausea, No new weakness tingling or numbness, No Cough - SOB.    Assessment  & Plan :    1. Palpitations - nonspecific chest discomfort. EKG nonacute, troponin so far negative, refuses aspirin due to previous history of GI bleed. On beta blocker and statin which will be continued, question multiple PVCs on telemetry in the ER, electrolytes stable, cardiology to evaluate. Echogram ordered.    2. Dyslipidemia. Continue home dose statin. Dose increased as LDL was above cold.   3. History of stroke. Currently on statin only for secondary prevention, dose adjusted, not on aspirin due to GI bleed in the past.   4. Essential hypertension. On Norvasc along with ARB and a beta blocker. Continue to monitor.   5. GERD. On PPI.    Code Status : Full  Family Communication  : none present  Disposition Plan  : Home 1-2 days  Consults  :  Cards  Procedures  :   TTE  DVT Prophylaxis  :    Heparin    Lab Results  Component Value Date   PLT 230 07/24/2015    Inpatient Medications  Scheduled Meds: . amLODipine  10 mg Oral q morning - 10a  . atorvastatin  80 mg Oral q1800  . famotidine  20 mg Oral BID  . heparin  5,000 Units Subcutaneous 3 times per day  . [START ON 07/25/2015] Influenza vac split quadrivalent PF  0.5 mL Intramuscular Tomorrow-1000  . [START ON 07/25/2015] irbesartan  150 mg Oral QHS  .  irbesartan  75 mg Oral q morning - 10a  . nebivolol  5 mg Oral q morning - 10a  . sodium chloride  3 mL Intravenous Q12H   Continuous Infusions: . sodium chloride 75 mL/hr at 07/24/15 1100   PRN Meds:.acetaminophen **OR** acetaminophen, ALPRAZolam, hydrALAZINE, morphine injection, nitroGLYCERIN, ondansetron **OR** ondansetron (ZOFRAN) IV  Antibiotics  :     Anti-infectives    None        Objective:   Filed Vitals:   07/24/15 0700 07/24/15 0715 07/24/15 0730 07/24/15 0750  BP: 146/77 155/80 160/80 155/84  Pulse: 71 78 81 67  Temp:    98 F (36.7 C)  TempSrc:    Oral  Resp: 17 18 15 16   Height:    5\' 3"  (1.6 m)  Weight:    72.3 kg (159 lb 6.3 oz)  SpO2: 95% 94% 96% 98%    Wt Readings from Last 3 Encounters:  07/24/15 72.3 kg (159 lb 6.3 oz)  08/13/14 73.1 kg (161 lb 2.5 oz)  08/09/14 72.576 kg (160 lb)     Intake/Output Summary (Last 24 hours) at 07/24/15 1144 Last data filed at 07/24/15 1030  Gross per 24 hour  Intake   1120 ml  Output  400 ml  Net    720 ml     Physical Exam  Awake Alert, Oriented X 3, No new F.N deficits, Normal affect [place].AT,PERRAL Supple Neck,No JVD, No cervical lymphadenopathy appriciated.  Symmetrical Chest wall movement, Good air movement bilaterally, CTAB RRR,No Gallops,Rubs or new Murmurs, No Parasternal Heave +ve B.Sounds, Abd Soft, No tenderness, No organomegaly appriciated, No rebound - guarding or rigidity. No Cyanosis, Clubbing or edema, No new Rash or bruise       Data Review:   Micro Results Recent Results (from the past 240 hour(s))  MRSA PCR Screening     Status: None   Collection Time: 07/24/15  8:00 AM  Result Value Ref Range Status   MRSA by PCR NEGATIVE NEGATIVE Final    Comment:        The GeneXpert MRSA Assay (FDA approved for NASAL specimens only), is one component of a comprehensive MRSA colonization surveillance program. It is not intended to diagnose MRSA infection nor to guide or monitor treatment  for MRSA infections.     Radiology Reports Dg Chest 2 View  07/24/2015   CLINICAL DATA:  Chest pain. Irregular heartbeat. Hypertension. Nonsmoker. History previous stroke.  EXAM: CHEST  2 VIEW  COMPARISON:  05/31/2014  FINDINGS: Normal heart size and pulmonary vascularity. Linear scarring in the lung bases similar to previous study. No focal airspace disease or consolidation in the lungs. No blunting of costophrenic angles. No pneumothorax. Mediastinal contours appear intact. Degenerative changes in the spine.  IMPRESSION: No active cardiopulmonary disease.   Electronically Signed   By: Burman Nieves M.D.   On: 07/24/2015 03:40     CBC  Recent Labs Lab 07/24/15 0255  WBC 9.5  HGB 14.0  HCT 43.1  PLT 230  MCV 88.5  MCH 28.7  MCHC 32.5  RDW 13.5  LYMPHSABS 2.6  MONOABS 0.4  EOSABS 0.2  BASOSABS 0.1    Chemistries   Recent Labs Lab 07/24/15 0255  NA 136  K 3.6  CL 103  CO2 22  GLUCOSE 151*  BUN 14  CREATININE 0.75  CALCIUM 9.1  MG 2.0   ------------------------------------------------------------------------------------------------------------------ estimated creatinine clearance is 71.3 mL/min (by C-G formula based on Cr of 0.75). ------------------------------------------------------------------------------------------------------------------ No results for input(s): HGBA1C in the last 72 hours. ------------------------------------------------------------------------------------------------------------------  Recent Labs  07/24/15 0559  CHOL 259*  HDL 38*  LDLCALC 184*  TRIG 184*  CHOLHDL 6.8   ------------------------------------------------------------------------------------------------------------------  Recent Labs  07/24/15 0255  TSH 4.062   ------------------------------------------------------------------------------------------------------------------ No results for input(s): VITAMINB12, FOLATE, FERRITIN, TIBC, IRON, RETICCTPCT in the last  72 hours.  Coagulation profile  Recent Labs Lab 07/24/15 0559  INR 1.10    No results for input(s): DDIMER in the last 72 hours.  Cardiac Enzymes  Recent Labs Lab 07/24/15 0559  TROPONINI <0.03   ------------------------------------------------------------------------------------------------------------------ Invalid input(s): POCBNP   Time Spent in minutes  35   Orvel Cutsforth K M.D on 07/24/2015 at 11:44 AM  Between 7am to 7pm - Pager - [phone]  After 7pm go to [email or web address] - password [facility]  [facility] -  Office  [phone]

## 2015-07-24 NOTE — ED Notes (Signed)
Right before bed pt started having "an empty feeling in my chest" with nausea, no vomiting, non radiating. Pt states that she feels better when she sits forward. Every time the pt has a pvc the pt gets that "empty feeling" Pt has hx of 2 prior cva's. Pt received 1 nitro and it brought chest discomfort down. Pt feels better when leaning forward. 12 lead showed NSR with pvc's. No cardiac hx. Pt alert and oriented x 4. BP 166/100, HR 80, RR 18, CBG 127, Sat 98% on RA, pt was placed on o2 for pvc's.

## 2015-07-24 NOTE — Progress Notes (Signed)
Pt arrived to [address] [place], pt is a&0x4, no skin issues noted. Pt intermittitently complaining of shallow feeling in chest which is followed by a pvc on monitor. MD Thedore Mins at bedside. Will continue to monitor.

## 2015-07-24 NOTE — H&P (Signed)
[facility] History and Physical  AITANA BURRY ZOX:[id number] DOB: 1956/10/19 DOA: 07/24/2015  Referring physician: ED physician PCP: Delphia Grates  Specialists:   Chief Complaint: Chest discomfort  HPI: Shari Taylor is a 59 y.o. female with PMH of hypertension, hyperlipidemia, GERD, anxiety, chronic disease, hiatal hernia, Schatzki's ring, stroke, peptic ulcer disease, GI bleeding,  Patient reports that she started having chest discomfort, described as "feeling hollow in left chest" at midnight. The symptoms is aggravated by laying flat. She feels like her heart is beating irregularly. She felt that she was going to pass out, but it did not. She denies chest pain, shortness of breath, cough, fever or chills. It is associated with nausea, but no vomiting, abdominal pain, or diarrhea. No symptoms of UTI, leg edema, unilateral weakness or rashes. Patient states that her blood pressure was significantly elevated at 220/116 when EMS arrived. Per nurse note, each time the pt has a pvc, the pt gets that "empty feeling". I did dot see PVC on tele. Patient denies drug use and heavy drinking.  In ED, patient was found to have new Q-wave in inferior leads and V4-V6, negative troponin, WBC 9.5, no tachycardia, TSH 4.062, free T4 1 0.03, electrolytes okay. Chest x-ray negative for acute abnormalities.  Where does patient live?   At home  Can patient participate in ADLs?  Yes     Barely     None  Little     Some   Review of Systems:   General: no fevers, chills, no changes in body weight, has fatigue HEENT: no blurry vision, hearing changes or sore throat Pulm: no dyspnea, coughing, wheezing CV: no chest pain, palpitations. Has chest discomfort. Abd: no nausea, vomiting, abdominal pain, diarrhea, constipation GU: no dysuria, burning on urination, increased urinary frequency, hematuria  Ext: no leg edema Neuro: no unilateral weakness, numbness, or tingling, no vision change or  hearing loss Skin: no rash MSK: No muscle spasm, no deformity, no limitation of range of movement in spin Heme: No easy bruising.  Travel history: No recent long distant travel.  Allergy:  Allergies  Allergen Reactions  . Penicillins Anaphylaxis  . Beef-Derived Products Other (See Comments)    Patient does not eat beef at all  . Clindamycin/Lincomycin     Intestinal bleeding  . Fentanyl Nausea And Vomiting    Patient refuses     Past Medical History  Diagnosis Date  . Hypertension   . GERD (gastroesophageal reflux disease)   . Crohn disease     "positive blood test-maybe no pathology, signs and symptoms present"  . H/O hiatal hernia   . Schatzki's ring   . Impacted cerumen   . Stroke 01/2012, 08/2012    x2  . Hypercholesteremia   . Pneumonia 1998    hx of  . History of kidney stones   . History of blood transfusion 2006    x6, no problems  . PONV (postoperative nausea and vomiting)     fentanyl  . PUD (peptic ulcer disease)   . GI bleeding     Past Surgical History  Procedure Laterality Date  . Appendectomy  1976  . Total abdominal hysterectomy  1977  . Bowel resection  2006    colonoscopy-bowel coil in IR to control bleeding-bowel died-resection  . Salpingoophorectomy Right 1978  . Colonoscopy      polyp removal led to GI bleed and ICU stay (2006)  . Esophagogastroduodenoscopy    . Tee without cardioversion  09/17/2012  Procedure: TRANSESOPHAGEAL ECHOCARDIOGRAM (TEE);  Surgeon: Lewayne Bunting, MD;  Location: [facility] ENDOSCOPY;  Service: Cardiovascular;  Laterality: N/A;  . Nephrolithotomy Right 08/13/2014    Procedure: NEPHROLITHOTOMY RIGHT PERCUTANEOUS WITH SURGEON ACCESS, double J stent;  Surgeon: Crist Fat, MD;  Location: [facility] ORS;  Service: Urology;  Laterality: Right;    Social History:  reports that she has never smoked. She has never used smokeless tobacco. She reports that she drinks alcohol. She reports that she does not use illicit  drugs.  Family History:  Family History  Problem Relation Age of Onset  . Colon polyps Father   . Pancreatic cancer Father   . Melanoma Mother   . Hearing loss Brother   . Obesity Sister   . Hypertension Sister   . Diabetes type II Sister      Prior to Admission medications   Medication Sig Start Date End Date Taking? Authorizing Provider  ALPRAZolam Prudy Feeler) 0.25 MG tablet Take 0.25 mg by mouth at bedtime as needed for anxiety.   Yes Historical Provider, MD  Alum Hydroxide-Mag Carbonate (GAVISCON PO) Take 1 tablet by mouth every 6 (six) hours as needed (reflux). For indigestion/reflux   Yes Historical Provider, MD  amLODipine (NORVASC) 10 MG tablet Take 10 mg by mouth every morning.    Yes Historical Provider, MD  nebivolol (BYSTOLIC) 10 MG tablet Take 5 mg by mouth every morning.    Yes Historical Provider, MD  ranitidine (ZANTAC) 150 MG capsule Take 150 mg by mouth 2 (two) times daily as needed. For reflux   Yes Historical Provider, MD  simvastatin (ZOCOR) 20 MG tablet Take 20 mg by mouth daily.   Yes Historical Provider, MD  valsartan (DIOVAN) 160 MG tablet Take 160 mg by mouth every morning.    Yes Historical Provider, MD  valsartan (DIOVAN) 80 MG tablet Take 80 mg by mouth at bedtime.   Yes Historical Provider, MD  ciprofloxacin (CIPRO) 500 MG tablet Take 1 tablet (500 mg total) by mouth once. Patient not taking: Reported on 07/24/2015 08/13/14   Crist Fat, MD  ciprofloxacin (CIPRO) 500 MG tablet Take 1 tablet (500 mg total) by mouth 2 (two) times daily. Patient not taking: Reported on 07/24/2015 08/14/14   Jethro Bolus, MD  docusate sodium 100 MG CAPS Take 100 mg by mouth 2 (two) times daily. Patient not taking: Reported on 07/24/2015 08/13/14   Crist Fat, MD  oxyCODONE (OXY IR/ROXICODONE) 5 MG immediate release tablet Take 1-2 tablets (5-10 mg total) by mouth every 4 (four) hours as needed for severe pain. Patient not taking: Reported on 07/24/2015 08/13/14    Crist Fat, MD  Trospium Chloride 60 MG CP24 Take 1 capsule (60 mg total) by mouth daily. Patient not taking: Reported on 07/24/2015 08/13/14   Crist Fat, MD    Physical Exam: Filed Vitals:   07/24/15 0445 07/24/15 0500 07/24/15 0515 07/24/15 0530  BP: 159/82 145/75 153/80 174/99  Pulse: 70 69 80 93  Resp: 16 17 22 22   Height:      Weight:      SpO2: 96% 96% 100% 97%   General: Not in acute distress. Anxious looking. HEENT:       Eyes: PERRL, EOMI, no scleral icterus.       ENT: No discharge from the ears and nose, no pharynx injection, no tonsillar enlargement.        Neck: No JVD, no bruit, no mass felt. Heme: No neck lymph node enlargement.  Cardiac: S1/S2, RRR, No murmurs, No gallops or rubs. Pulm: No rales, wheezing, rhonchi or rubs. Abd: Soft, nondistended, nontender, no rebound pain, no organomegaly, BS present. Ext: No pitting leg edema bilaterally. 2+DP/PT pulse bilaterally. Musculoskeletal: No joint deformities, No joint redness or warmth, no limitation of ROM in spin. Skin: No rashes.  Neuro: Alert, oriented X3, cranial nerves II-XII grossly intact, muscle strength 5/5 in all extremities, sensation to light touch intact. Brachial reflex 2+ bilaterally. Knee reflex 1+ bilaterally. Negative Babinski's sign. Normal finger to nose test. Psych: Patient is not psychotic, no suicidal or hemocidal ideation.  Labs on Admission:  Basic Metabolic Panel:  Recent Labs Lab 07/24/15 0255  NA 136  K 3.6  CL 103  CO2 22  GLUCOSE 151*  BUN 14  CREATININE 0.75  CALCIUM 9.1  MG 2.0   Liver Function Tests: No results for input(s): AST, ALT, ALKPHOS, BILITOT, PROT, ALBUMIN in the last 168 hours. No results for input(s): LIPASE, AMYLASE in the last 168 hours. No results for input(s): AMMONIA in the last 168 hours. CBC:  Recent Labs Lab 07/24/15 0255  WBC 9.5  NEUTROABS 6.2  HGB 14.0  HCT 43.1  MCV 88.5  PLT 230   Cardiac Enzymes: No results for  input(s): CKTOTAL, CKMB, CKMBINDEX, TROPONINI in the last 168 hours.  BNP (last 3 results) No results for input(s): BNP in the last 8760 hours.  ProBNP (last 3 results) No results for input(s): PROBNP in the last 8760 hours.  CBG: No results for input(s): GLUCAP in the last 168 hours.  Radiological Exams on Admission: Dg Chest 2 View  07/24/2015   CLINICAL DATA:  Chest pain. Irregular heartbeat. Hypertension. Nonsmoker. History previous stroke.  EXAM: CHEST  2 VIEW  COMPARISON:  05/31/2014  FINDINGS: Normal heart size and pulmonary vascularity. Linear scarring in the lung bases similar to previous study. No focal airspace disease or consolidation in the lungs. No blunting of costophrenic angles. No pneumothorax. Mediastinal contours appear intact. Degenerative changes in the spine.  IMPRESSION: No active cardiopulmonary disease.   Electronically Signed   By: Burman Nieves M.D.   On: 07/24/2015 03:40    EKG: Independently reviewed.  Abnormal findings: Q wave in inferior leads and V4-6, which is new compared with previous EKG on 05/31/14. Early R-wave progression.  Assessment/Plan Principal Problem:   Chest discomfort Active Problems:   HTN (hypertension)   GERD (gastroesophageal reflux disease)   Hyperlipidemia with target LDL less than 100   Stroke   Anxiety  Chest discomfort: Etiology is not clear. No infiltration on chest x-ray. No chest pain or oxygen saturation desaturation, less likely to have pulmonary embolism. Patient's EKG showed Q-wave in inferior leads and V4 to 6, indicating that patient had MI at sometime point. Currently patient does not have chest pain or shortness breath. She does not have "hollow" feeling at this moment.  - will admit to tele bed - cycle CE q6 x3 and repeat her EKG in the am  - prn Nitroglycerin, Morphine and Bystoli - pt refused ASA due to history of GI bleeding - switch zocor to lipitor 80 mg daily - Risk factor stratification: will check FLP,  UDS and A1C (TSH normal). - 2d echo - may get cardiology consult in AM  HTN (hypertension): current bp is 164/84. Her blood pressure was elevated at 220/116 early.  -Amlodipine, irbesartan -Hydralazine IV when necessary  GERD: -Protonix  HLD: Last LDL was 133 on 01/15/12 -Zocor to Lipitor  -f/u FLP  Hx of stroke: Not on ASA due to hx of GIB -Continue statin and blood pressure control  Anxiety: No suicidal or homicidal ideations. -Continue home medications:  Xanax  DVT ppx: SQ Heparin   Code Status: Full code Family Communication: None at bed side.  Disposition Plan: Admit to inpatient   Date of Service 07/24/2015    Lorretta Harp [facility] Pager [phone]  If 7PM-7AM, please contact night-coverage [email or web address] Password [email or web address] 07/24/2015, 6:10 AM

## 2015-07-25 ENCOUNTER — Other Ambulatory Visit: Payer: Self-pay | Admitting: Physician Assistant

## 2015-07-25 ENCOUNTER — Inpatient Hospital Stay (HOSPITAL_COMMUNITY): Payer: BLUE CROSS/BLUE SHIELD

## 2015-07-25 DIAGNOSIS — I340 Nonrheumatic mitral (valve) insufficiency: Secondary | ICD-10-CM

## 2015-07-25 DIAGNOSIS — R931 Abnormal findings on diagnostic imaging of heart and coronary circulation: Secondary | ICD-10-CM

## 2015-07-25 DIAGNOSIS — R9081 Abnormal echoencephalogram: Secondary | ICD-10-CM | POA: Insufficient documentation

## 2015-07-25 DIAGNOSIS — I493 Ventricular premature depolarization: Principal | ICD-10-CM | POA: Insufficient documentation

## 2015-07-25 LAB — GLUCOSE, CAPILLARY: GLUCOSE-CAPILLARY: 97 mg/dL (ref 65–99)

## 2015-07-25 LAB — COMPREHENSIVE METABOLIC PANEL
ALBUMIN: 3.4 g/dL — AB (ref 3.5–5.0)
ALK PHOS: 71 U/L (ref 38–126)
ALT: 27 U/L (ref 14–54)
ANION GAP: 6 (ref 5–15)
AST: 21 U/L (ref 15–41)
BILIRUBIN TOTAL: 0.6 mg/dL (ref 0.3–1.2)
BUN: 14 mg/dL (ref 6–20)
CALCIUM: 8.7 mg/dL — AB (ref 8.9–10.3)
CO2: 25 mmol/L (ref 22–32)
CREATININE: 0.9 mg/dL (ref 0.44–1.00)
Chloride: 110 mmol/L (ref 101–111)
GFR calc Af Amer: >60 mL/min (ref >60)
GFR calc non Af Amer: >60 mL/min (ref >60)
GLUCOSE: 119 mg/dL — AB (ref 65–99)
Potassium: 4.4 mmol/L (ref 3.5–5.1)
SODIUM: 141 mmol/L (ref 135–145)
TOTAL PROTEIN: 6 g/dL — AB (ref 6.5–8.1)

## 2015-07-25 LAB — CBC
HCT: 42.7 % (ref 36.0–46.0)
HEMOGLOBIN: 13.3 g/dL (ref 12.0–15.0)
MCH: 28.2 pg (ref 26.0–34.0)
MCHC: 31.1 g/dL (ref 30.0–36.0)
MCV: 90.5 fL (ref 78.0–100.0)
PLATELETS: 231 10*3/uL (ref 150–400)
RBC: 4.72 MIL/uL (ref 3.87–5.11)
RDW: 13.9 % (ref 11.5–15.5)
WBC: 8.6 10*3/uL (ref 4.0–10.5)

## 2015-07-25 LAB — HEMOGLOBIN A1C
Hgb A1c MFr Bld: 6.8 % — ABNORMAL HIGH (ref 4.8–5.6)
Mean Plasma Glucose: 148 mg/dL

## 2015-07-25 MED ORDER — ATORVASTATIN CALCIUM 80 MG PO TABS: 80.0000 mg | ORAL_TABLET | Freq: Every day | ORAL | Status: AC

## 2015-07-25 MED ORDER — HYDRALAZINE HCL 50 MG PO TABS
50.0000 mg | ORAL_TABLET | Freq: Three times a day (TID) | ORAL | Status: DC
  Filled 2015-07-25 (×3): qty 1

## 2015-07-25 MED ORDER — HYDRALAZINE HCL 50 MG PO TABS: 50.0000 mg | ORAL_TABLET | Freq: Three times a day (TID) | ORAL | Status: AC

## 2015-07-25 MED ORDER — VALSARTAN 160 MG PO TABS: 160.0000 mg | ORAL_TABLET | Freq: Every morning | ORAL | Status: AC

## 2015-07-25 NOTE — Progress Notes (Signed)
Patient Name: Shari Taylor Date of Encounter: 07/25/2015  Primary Cardiologist: new - Dr. Herbie Baltimore   Principal Problem:   Chest discomfort Active Problems:   HTN (hypertension)   GERD (gastroesophageal reflux disease)   Hyperlipidemia with target LDL less than 100   Stroke   Anxiety   Palpitations    SUBJECTIVE  Denies any CP or SOB. No episode last night.  CURRENT MEDS . amLODipine  10 mg Oral q morning - 10a  . atorvastatin  80 mg Oral q1800  . famotidine  20 mg Oral BID  . heparin  5,000 Units Subcutaneous 3 times per day  . Influenza vac split quadrivalent PF  0.5 mL Intramuscular Tomorrow-1000  . irbesartan  150 mg Oral QHS  . irbesartan  75 mg Oral q morning - 10a  . nebivolol  5 mg Oral q morning - 10a  . sodium chloride  3 mL Intravenous Q12H    OBJECTIVE  Filed Vitals:   07/24/15 2342 07/25/15 0350 07/25/15 0700 07/25/15 0800  BP: 150/81 130/90  146/94  Pulse: 65 57  62  Temp: 97.6 F (36.4 C) 98 F (36.7 C) 98.1 F (36.7 C)   TempSrc: Oral Oral Oral   Resp: 17 17  17   Height:      Weight:      SpO2: 97% 97%  97%    Intake/Output Summary (Last 24 hours) at 07/25/15 1106 Last data filed at 07/25/15 0800  Gross per 24 hour  Intake 2433.75 ml  Output   1300 ml  Net 1133.75 ml   Filed Weights   07/24/15 0223 07/24/15 0750  Weight: 160 lb (72.576 kg) 159 lb 6.3 oz (72.3 kg)    PHYSICAL EXAM  General: Pleasant, NAD. Neuro: Alert and oriented X 3. Moves all extremities spontaneously. Psych: Normal affect. HEENT:  Normal  Neck: Supple without bruits or JVD. Lungs:  Resp regular and unlabored, CTA. Heart: RRR no s3, s4, or murmurs. Abdomen: Soft, non-tender, non-distended, BS + x 4.  Extremities: No clubbing, cyanosis or edema. DP/PT/Radials 2+ and equal bilaterally.  Accessory Clinical Findings  CBC  Recent Labs  07/24/15 0255 07/25/15 0255  WBC 9.5 8.6  NEUTROABS 6.2  --   HGB 14.0 13.3  HCT 43.1 42.7  MCV 88.5 90.5  PLT  230 231   Basic Metabolic Panel  Recent Labs  07/24/15 0255 07/25/15 0255  NA 136 141  K 3.6 4.4  CL 103 110  CO2 22 25  GLUCOSE 151* 119*  BUN 14 14  CREATININE 0.75 0.90  CALCIUM 9.1 8.7*  MG 2.0  --    Liver Function Tests  Recent Labs  07/25/15 0255  AST 21  ALT 27  ALKPHOS 71  BILITOT 0.6  PROT 6.0*  ALBUMIN 3.4*   Cardiac Enzymes  Recent Labs  07/24/15 0559 07/24/15 1115 07/24/15 1803  TROPONINI <0.03 <0.03 <0.03   Fasting Lipid Panel  Recent Labs  07/24/15 0559  CHOL 259*  HDL 38*  LDLCALC 184*  TRIG 184*  CHOLHDL 6.8   Thyroid Function Tests  Recent Labs  07/24/15 0255  TSH 4.062    TELE NSR without significant ventricular ectopy    ECG  No new EKG  Echocardiogram 07/25/2015  LV EF: 45% -  50%  ------------------------------------------------------------------- History:  PMH: Chest discomfort, feeling &quot;hollow&quot;. PMH: Stroke. Transient ischemic attack. Risk factors: Hypertension. Dyslipidemia.  ------------------------------------------------------------------- Study Conclusions  - Left ventricle: The cavity size was normal. There was mild concentric  hypertrophy. Systolic function was mildly reduced. The estimated ejection fraction was in the range of 45% to 50%. Doppler parameters are consistent with abnormal left ventricular relaxation (grade 1 diastolic dysfunction). Doppler parameters are consistent with elevated ventricular end-diastolic filling pressure. - Aortic valve: Trileaflet; normal thickness leaflets. There was no regurgitation. - Aortic root: The aortic root was normal in size. - Mitral valve: Structurally normal valve. There was no regurgitation. - Left atrium: The atrium was normal in size. - Right ventricle: The cavity size was normal. Wall thickness was normal. Systolic function was normal. - Right atrium: The atrium was normal in size. - Tricuspid valve: There was  mild regurgitation. - Pulmonic valve: There was no regurgitation. - Pulmonary arteries: Systolic pressure was within the normal range. - Inferior vena cava: The vessel was normal in size. - Pericardium, extracardiac: There was no pericardial effusion.  Impressions:  - The study is of limited quality but there appear to be new hypokinesis in the basal and mid inferior and inferolateral walls. Filling pressures are elevated.    Radiology/Studies  Dg Chest 2 View  07/24/2015   CLINICAL DATA:  Chest pain. Irregular heartbeat. Hypertension. Nonsmoker. History previous stroke.  EXAM: CHEST  2 VIEW  COMPARISON:  05/31/2014  FINDINGS: Normal heart size and pulmonary vascularity. Linear scarring in the lung bases similar to previous study. No focal airspace disease or consolidation in the lungs. No blunting of costophrenic angles. No pneumothorax. Mediastinal contours appear intact. Degenerative changes in the spine.  IMPRESSION: No active cardiopulmonary disease.   Electronically Signed   By: Burman Nieves M.D.   On: 07/24/2015 03:40    ASSESSMENT AND PLAN  pleasant 59 year old Caucasian female with past medical history of HTN, HLD, hiatal hernia, Schatzki's ring, history of stroke, PUD and GI bleed came in with chest discomfort lasting a few seconds at a time  1. Atypical chest discomfort likely related to symptomatic PVCs and hypertensive urgency - she missed her bystolic yesterday which would have helped with her symptom, it has been restarted - EKG does have more prominent Q waves in inferolateral leads however symptom does not suggest ACS - not sure why she was having more symptom when laying down, ?timing of PVCs, I laid her flat in the room, no reproducible symptom   - Echo 07/25/2015 EF 45-50%, grade 1 diastolic dysfunction, limited quality study but appears to have new hypokinesis of basal and mid inferior and inferolateral walls.  - will  discuss with MD regarding inpatient vs outpatient workup, she already ate this morning. Potentially do outpatient myoview since her symptom is very atypical.   2. Hypertensive urgency - SBP >200 by EMS, per pt, she does not take her BP med everyday - with restart of medication, her BP hopefully will be better controlled  3. HTN 4. HLD 5. hiatal hernia/schatzki's ring 6. history of stroke 7. PUD  8. GI bleed after polypectomy s/p partial colon removal in 2006  Ramond Dial PA-C Pager: [phone]   I saw evaluated the patient prior to her discharge. I discussed the case with Mr. Lisabeth Devoid (who saw the patient along with me).  The atypical sounding chest discomfort which is probably consistent with her feeling her PVCs. We'll restart her beta blocker which can probably be titrated up as an outpatient. Interestingly, her tachycardia showed reduced ejection fraction with possible regional wall motion abnormality. She is not having active symptoms currently and has ruled out for MI. He became easily consider outpatient Myoview prior  to or following up with me.  It would appear that her blood pressure issues with a hypertensive emergency very well could have been responsible for some of her symptoms as well.  Continue to titrate medications to improve blood pressure control. This is can be done as an outpatient.   Contacted the hospitalist service to inform them of my Recommendations  --> theny plan on discharging her today.   Marykay Lex, M.D., M.S. Interventional Cardiologist   Pager # [phone]

## 2015-07-25 NOTE — Progress Notes (Signed)
  Echocardiogram 2D Echocardiogram has been performed.  Shari Taylor 07/25/2015, 10:21 AM

## 2015-07-25 NOTE — Progress Notes (Signed)
Pt given discharge packet. IV removed and flu vaccine given. Questions answered concerning medication regimen. Pt states no further questions at this time.

## 2015-07-25 NOTE — Discharge Summary (Signed)
Shari Taylor, is a 59 y.o. female  DOB 11/15/1955  MRN [id number].  Admission date:  07/24/2015  Admitting Physician  Lorretta Harp, MD  Discharge Date:  07/25/2015   Primary MD  Elvera Lennox, PA-C  Recommendations for primary care physician for things to follow:   Monitor BMP, blood pressure closely. Repeat BMP in a week. Must follow with cardiology in 1-2 weeks.   Admission Diagnosis  Stroke [I63.9] Chest pain, unspecified chest pain type [R07.9]   Discharge Diagnosis  Stroke [I63.9] Chest pain, unspecified chest pain type [R07.9]     Principal Problem:   Chest discomfort Active Problems:   Essential hypertension   GERD (gastroesophageal reflux disease)   Hyperlipidemia with target LDL less than 100   Stroke   Anxiety   Palpitations      Past Medical History  Diagnosis Date  . Hypertension   . GERD (gastroesophageal reflux disease)   . Crohn disease     "positive blood test-maybe no pathology, signs and symptoms present"  . H/O hiatal hernia   . Schatzki's ring   . Impacted cerumen   . Stroke 01/2012, 08/2012    x2  . Hypercholesteremia   . Pneumonia 1998    hx of  . History of kidney stones   . History of blood transfusion 2006    x6, no problems  . PONV (postoperative nausea and vomiting)     fentanyl  . PUD (peptic ulcer disease)   . GI bleeding     Past Surgical History  Procedure Laterality Date  . Appendectomy  1976  . Total abdominal hysterectomy  1977  . Bowel resection  2006    colonoscopy-bowel coil in IR to control bleeding-bowel died-resection  . Salpingoophorectomy Right 1978  . Colonoscopy      polyp removal led to GI bleed and ICU stay (2006)  . Esophagogastroduodenoscopy    . Tee without cardioversion  09/17/2012    Procedure: TRANSESOPHAGEAL ECHOCARDIOGRAM  (TEE);  Surgeon: Lewayne Bunting, MD;  Location: [facility] ENDOSCOPY;  Service: Cardiovascular;  Laterality: N/A;  . Nephrolithotomy Right 08/13/2014    Procedure: NEPHROLITHOTOMY RIGHT PERCUTANEOUS WITH SURGEON ACCESS, double J stent;  Surgeon: Crist Fat, MD;  Location: [facility] ORS;  Service: Urology;  Laterality: Right;       HPI  from the history and physical done on the day of admission:     Shari Taylor is a 59 y.o. female with PMH of hypertension, hyperlipidemia, GERD, anxiety, chronic disease, hiatal hernia, Schatzki's ring, stroke, peptic ulcer disease, GI bleeding,  Patient reports that she started having chest discomfort, described as "feeling hollow in left chest" at midnight. The symptoms is aggravated by laying flat. She feels like her heart is beating irregularly. She felt that she was going to pass out, but it did not. She denies chest pain, shortness of breath, cough, fever or chills. It is associated with nausea, but no vomiting, abdominal pain, or diarrhea. No symptoms of UTI, leg edema, unilateral weakness or rashes. Patient  states that her blood pressure was significantly elevated at 220/116 when EMS arrived. Per nurse note, each time the pt has a pvc, the pt gets that "empty feeling". I did dot see PVC on tele. Patient denies drug use and heavy drinking.  In ED, patient was found to have new Q-wave in inferior leads and V4-V6, negative troponin, WBC 9.5, no tachycardia, TSH 4.062, free T4 1 0.03, electrolytes okay. Chest x-ray negative for acute abnormalities.    Hospital Course:    1. Palpitations - nonspecific chest discomfort. EKG nonacute, troponin so far negative, refuses aspirin due to previous history of GI bleed. On beta blocker and statin which will be continued, did have PVCs in the ER, electrolytes stable, seen by cardiology, echogram evaluated, outpatient stress test by Dr. Herbie Baltimore to be done in the next few weeks, patient's symptom free her to go  home.   2. Dyslipidemia. LDL was above 150, switch to high intensity statin treatment, monitor lipid panel  in 6-8 weeks..   3. History of stroke. Currently on statin only for secondary prevention, dose adjusted, not on aspirin due to GI bleed in the past.   4. Essential hypertension. On Norvasc along with ARB and a beta blocker. Added hydralazine for better control. Request PCP to recheck her home dose ARB which appears to be 240 mg a day confirmed with the patient.  5. GERD. On PPI.         Discharge Condition: Stable  Follow UP  Follow-up Information    Follow up with [name],[name], PA-C. Schedule an appointment as soon as possible for a visit in 1 week.   Specialty:  Physician Assistant   Contact information:   [phone] [address] [place] [address] [phone]       Follow up with Abelino Derrick, PA-C On 08/23/2015.   Specialties:  Cardiology, Radiology   Why:  10:30am. Cardiology followup   Contact information:   [address] [place] [address] [phone]       Follow up with [facility].   Why:  Office will contact you to arrange outpatient stress, please no food or drink in the morning of the stress test. Sips of water with medication ok.    Contact information:   [address] [place] [address] [phone]       Consults obtained - Cards  Diet and Activity recommendation: See Discharge Instructions below  Discharge Instructions       Discharge Instructions    Diet - low sodium heart healthy    Complete by:  As directed      Discharge instructions    Complete by:  As directed   Follow with Primary MD [name],[name], PA-C in 7 days   Get CBC, CMP, Lipid Panel,  2 view Chest X ray checked  by Primary MD next visit.    Activity: As tolerated with Full fall precautions use walker/cane & assistance as needed   Disposition Home     Diet: Heart Healthy   For Heart failure  patients - Check your Weight same time everyday, if you gain over 2 pounds, or you develop in leg swelling, experience more shortness of breath or chest pain, call your Primary MD immediately. Follow Cardiac Low Salt Diet and 1.5 lit/day fluid restriction.   On your next visit with your primary care physician please Get Medicines reviewed and adjusted.   Please request your Prim.MD to go over all Hospital Tests and Procedure/Radiological  results at the follow up, please get all Hospital records sent to your Prim MD by signing hospital release before you go home.   If you experience worsening of your admission symptoms, develop shortness of breath, life threatening emergency, suicidal or homicidal thoughts you must seek medical attention immediately by calling 911 or calling your MD immediately  if symptoms less severe.  You Must read complete instructions/literature along with all the possible adverse reactions/side effects for all the Medicines you take and that have been prescribed to you. Take any new Medicines after you have completely understood and accpet all the possible adverse reactions/side effects.   Do not drive, operating heavy machinery, perform activities at heights, swimming or participation in water activities or provide baby sitting services if your were admitted for syncope or siezures until you have seen by Primary MD or a Neurologist and advised to do so again.  Do not drive when taking Pain medications.    Do not take more than prescribed Pain, Sleep and Anxiety Medications  Special Instructions: If you have smoked or chewed Tobacco  in the last 2 yrs please stop smoking, stop any regular Alcohol  and or any Recreational drug use.  Wear Seat belts while driving.   Please note  You were cared for by a hospitalist during your hospital stay. If you have any questions about your discharge medications or the care you received while you were in the hospital after you are  discharged, you can call the unit and asked to speak with the hospitalist on call if the hospitalist that took care of you is not available. Once you are discharged, your primary care physician will handle any further medical issues. Please note that NO REFILLS for any discharge medications will be authorized once you are discharged, as it is imperative that you return to your primary care physician (or establish a relationship with a primary care physician if you do not have one) for your aftercare needs so that they can reassess your need for medications and monitor your lab values.     Increase activity slowly    Complete by:  As directed              Discharge Medications       Medication List    STOP taking these medications        ciprofloxacin 500 MG tablet  Commonly known as:  CIPRO     simvastatin 20 MG tablet  Commonly known as:  ZOCOR      TAKE these medications        ALPRAZolam 0.25 MG tablet  Commonly known as:  XANAX  Take 0.25 mg by mouth at bedtime as needed for anxiety.     amLODipine 10 MG tablet  Commonly known as:  NORVASC  Take 10 mg by mouth every morning.     atorvastatin 80 MG tablet  Commonly known as:  LIPITOR  Take 1 tablet (80 mg total) by mouth daily at 6 PM.     DSS 100 MG Caps  Take 100 mg by mouth 2 (two) times daily.     GAVISCON PO  Take 1 tablet by mouth every 6 (six) hours as needed (reflux). For indigestion/reflux     hydrALAZINE 50 MG tablet  Commonly known as:  APRESOLINE  Take 1 tablet (50 mg total) by mouth every 8 (eight) hours.     nebivolol 10 MG tablet  Commonly known as:  BYSTOLIC  Take 5 mg by  mouth every morning.     oxyCODONE 5 MG immediate release tablet  Commonly known as:  Oxy IR/ROXICODONE  Take 1-2 tablets (5-10 mg total) by mouth every 4 (four) hours as needed for severe pain.     ranitidine 150 MG capsule  Commonly known as:  ZANTAC  Take 150 mg by mouth 2 (two) times daily as needed. For reflux      Trospium Chloride 60 MG Cp24  Take 1 capsule (60 mg total) by mouth daily.     valsartan 160 MG tablet  Commonly known as:  DIOVAN  Take 1 tablet (160 mg total) by mouth every morning.     valsartan 80 MG tablet  Commonly known as:  DIOVAN  Take 80 mg by mouth at bedtime.        Major procedures and Radiology Reports - PLEASE review detailed and final reports for all details, in brief -    TTE   - Left ventricle: The cavity size was normal. There was mild concentric hypertrophy. Systolic function was mildly reduced. The estimated ejection fraction was in the range of 45% to 50%. Doppler parameters are consistent with abnormal left ventricular relaxation (grade 1 diastolic dysfunction). Doppler parameters are consistent with elevated ventricular end-diastolic filling pressure. - Aortic valve: Trileaflet; normal thickness leaflets. There was no regurgitation. - Aortic root: The aortic root was normal in size. - Mitral valve: Structurally normal valve. There was no regurgitation. - Left atrium: The atrium was normal in size. - Right ventricle: The cavity size was normal. Wall thickness was normal. Systolic function was normal. - Right atrium: The atrium was normal in size. - Tricuspid valve: There was mild regurgitation. - Pulmonic valve: There was no regurgitation. - Pulmonary arteries: Systolic pressure was within the normal range. - Inferior vena cava: The vessel was normal in size. - Pericardium, extracardiac: There was no pericardial effusion.  Impressions:  - The study is of limited quality but there appear to be new hypokinesis in the basal and mid inferior and inferolateral walls. Filling pressures are elevated.   Dg Chest 2 View  07/24/2015   CLINICAL DATA:  Chest pain. Irregular heartbeat. Hypertension. Nonsmoker. History previous stroke.  EXAM: CHEST  2 VIEW  COMPARISON:  05/31/2014  FINDINGS: Normal heart size and pulmonary  vascularity. Linear scarring in the lung bases similar to previous study. No focal airspace disease or consolidation in the lungs. No blunting of costophrenic angles. No pneumothorax. Mediastinal contours appear intact. Degenerative changes in the spine.  IMPRESSION: No active cardiopulmonary disease.   Electronically Signed   By: Burman Nieves M.D.   On: 07/24/2015 03:40    Micro Results      Recent Results (from the past 240 hour(s))  MRSA PCR Screening     Status: None   Collection Time: 07/24/15  8:00 AM  Result Value Ref Range Status   MRSA by PCR NEGATIVE NEGATIVE Final    Comment:        The GeneXpert MRSA Assay (FDA approved for NASAL specimens only), is one component of a comprehensive MRSA colonization surveillance program. It is not intended to diagnose MRSA infection nor to guide or monitor treatment for MRSA infections.        Today   Subjective    Shari Taylor today has no headache,no chest abdominal pain,no new weakness tingling or numbness, feels much better wants to go home today.     Objective   Blood pressure 160/95, pulse 61, temperature 97.6 F (  36.4 C), temperature source Oral, resp. rate 18, height 5\' 3"  (1.6 m), weight 72.3 kg (159 lb 6.3 oz), SpO2 99 %.   Intake/Output Summary (Last 24 hours) at 07/25/15 1302 Last data filed at 07/25/15 0800  Gross per 24 hour  Intake   1920 ml  Output    950 ml  Net    970 ml    Exam Awake Alert, Oriented x 3, No new F.N deficits, Normal affect [place].AT,PERRAL Supple Neck,No JVD, No cervical lymphadenopathy appriciated.  Symmetrical Chest wall movement, Good air movement bilaterally, CTAB RRR,No Gallops,Rubs or new Murmurs, No Parasternal Heave +ve B.Sounds, Abd Soft, Non tender, No organomegaly appriciated, No rebound -guarding or rigidity. No Cyanosis, Clubbing or edema, No new Rash or bruise   Data Review   CBC w Diff: Lab Results  Component Value Date   WBC 8.6 07/25/2015   HGB 13.3  07/25/2015   HCT 42.7 07/25/2015   PLT 231 07/25/2015   LYMPHOPCT 27 07/24/2015   MONOPCT 5 07/24/2015   EOSPCT 3 07/24/2015   BASOPCT 1 07/24/2015    CMP: Lab Results  Component Value Date   NA 141 07/25/2015   K 4.4 07/25/2015   CL 110 07/25/2015   CO2 25 07/25/2015   BUN 14 07/25/2015   CREATININE 0.90 07/25/2015   PROT 6.0* 07/25/2015   ALBUMIN 3.4* 07/25/2015   BILITOT 0.6 07/25/2015   ALKPHOS 71 07/25/2015   AST 21 07/25/2015   ALT 27 07/25/2015  .   Total Time in preparing paper work, data evaluation and todays exam - 35 minutes  Leroy Sea M.D on 07/25/2015 at 1:02 PM  [facility]   Office  [phone]

## 2015-07-25 NOTE — Discharge Instructions (Signed)
Follow with Primary MD [name],[name], PA-C in 7 days   Get CBC, CMP, Lipid Panel,  2 view Chest X ray checked  by Primary MD next visit.    Activity: As tolerated with Full fall precautions use walker/cane & assistance as needed   Disposition Home     Diet: Heart Healthy   For Heart failure patients - Check your Weight same time everyday, if you gain over 2 pounds, or you develop in leg swelling, experience more shortness of breath or chest pain, call your Primary MD immediately. Follow Cardiac Low Salt Diet and 1.5 lit/day fluid restriction.   On your next visit with your primary care physician please Get Medicines reviewed and adjusted.   Please request your Prim.MD to go over all Hospital Tests and Procedure/Radiological results at the follow up, please get all Hospital records sent to your Prim MD by signing hospital release before you go home.   If you experience worsening of your admission symptoms, develop shortness of breath, life threatening emergency, suicidal or homicidal thoughts you must seek medical attention immediately by calling 911 or calling your MD immediately  if symptoms less severe.  You Must read complete instructions/literature along with all the possible adverse reactions/side effects for all the Medicines you take and that have been prescribed to you. Take any new Medicines after you have completely understood and accpet all the possible adverse reactions/side effects.   Do not drive, operating heavy machinery, perform activities at heights, swimming or participation in water activities or provide baby sitting services if your were admitted for syncope or siezures until you have seen by Primary MD or a Neurologist and advised to do so again.  Do not drive when taking Pain medications.    Do not take more than prescribed Pain, Sleep and Anxiety Medications  Special Instructions: If you have smoked or chewed Tobacco  in the last 2 yrs please stop smoking, stop  any regular Alcohol  and or any Recreational drug use.  Wear Seat belts while driving.   Please note  You were cared for by a hospitalist during your hospital stay. If you have any questions about your discharge medications or the care you received while you were in the hospital after you are discharged, you can call the unit and asked to speak with the hospitalist on call if the hospitalist that took care of you is not available. Once you are discharged, your primary care physician will handle any further medical issues. Please note that NO REFILLS for any discharge medications will be authorized once you are discharged, as it is imperative that you return to your primary care physician (or establish a relationship with a primary care physician if you do not have one) for your aftercare needs so that they can reassess your need for medications and monitor your lab values.

## 2015-08-17 ENCOUNTER — Telehealth (HOSPITAL_COMMUNITY): Payer: Self-pay | Admitting: *Deleted

## 2015-08-18 ENCOUNTER — Ambulatory Visit (HOSPITAL_COMMUNITY)
Admission: RE | Admit: 2015-08-18 | Discharge: 2015-08-18 | Disposition: A | Discharge status: Home / Self Care | Payer: BLUE CROSS/BLUE SHIELD | Source: Ambulatory Visit | Attending: Cardiology | Admitting: Cardiology

## 2015-08-18 DIAGNOSIS — R9439 Abnormal result of other cardiovascular function study: Secondary | ICD-10-CM | POA: Diagnosis not present

## 2015-08-18 DIAGNOSIS — R5383 Other fatigue: Secondary | ICD-10-CM | POA: Insufficient documentation

## 2015-08-18 DIAGNOSIS — R0609 Other forms of dyspnea: Secondary | ICD-10-CM | POA: Diagnosis not present

## 2015-08-18 DIAGNOSIS — E785 Hyperlipidemia, unspecified: Secondary | ICD-10-CM | POA: Diagnosis not present

## 2015-08-18 DIAGNOSIS — R079 Chest pain, unspecified: Secondary | ICD-10-CM | POA: Insufficient documentation

## 2015-08-18 DIAGNOSIS — Z8249 Family history of ischemic heart disease and other diseases of the circulatory system: Secondary | ICD-10-CM | POA: Diagnosis not present

## 2015-08-18 DIAGNOSIS — R002 Palpitations: Secondary | ICD-10-CM | POA: Diagnosis not present

## 2015-08-18 DIAGNOSIS — R0602 Shortness of breath: Secondary | ICD-10-CM | POA: Diagnosis not present

## 2015-08-18 DIAGNOSIS — R931 Abnormal findings on diagnostic imaging of heart and coronary circulation: Secondary | ICD-10-CM | POA: Diagnosis not present

## 2015-08-18 DIAGNOSIS — I10 Essential (primary) hypertension: Secondary | ICD-10-CM | POA: Insufficient documentation

## 2015-08-18 DIAGNOSIS — R42 Dizziness and giddiness: Secondary | ICD-10-CM | POA: Insufficient documentation

## 2015-08-18 LAB — MYOCARDIAL PERFUSION IMAGING
CHL CUP NUCLEAR SRS: 9
CHL CUP NUCLEAR SSS: 18
CHL CUP STRESS STAGE 1 HR: 57 {beats}/min
CHL CUP STRESS STAGE 2 SPEED: 0 mph
CHL CUP STRESS STAGE 3 GRADE: 0 %
CHL CUP STRESS STAGE 3 HR: 100 {beats}/min
CHL CUP STRESS STAGE 3 SBP: 172 mmHg
CHL CUP STRESS STAGE 3 SPEED: 0 mph
CHL CUP STRESS STAGE 4 DBP: 103 mmHg
CHL CUP STRESS STAGE 4 GRADE: 0 %
CHL CUP STRESS STAGE 4 HR: 91 {beats}/min
CHL CUP STRESS STAGE 4 SBP: 176 mmHg
CSEPEW: 1 METS
CSEPPMHR: 62 %
LV dias vol: 82 mL
LVSYSVOL: 44 mL
Peak BP: 172 mmHg
Peak HR: 100 {beats}/min
Rest HR: 64 {beats}/min
SDS: 9
Stage 1 DBP: 111 mmHg
Stage 1 Grade: 0 %
Stage 1 SBP: 156 mmHg
Stage 1 Speed: 0 mph
Stage 2 Grade: 0 %
Stage 2 HR: 57 {beats}/min
Stage 3 DBP: 94 mmHg
Stage 4 Speed: 0 mph
TID: 1.09

## 2015-08-18 MED ORDER — REGADENOSON 0.4 MG/5ML IV SOLN
0.4000 mg | Freq: Once | INTRAVENOUS | Status: AC
  Administered 2015-08-18: 0.4 mg via INTRAVENOUS

## 2015-08-18 MED ORDER — TECHNETIUM TC 99M SESTAMIBI GENERIC - CARDIOLITE
10.8000 | Freq: Once | INTRAVENOUS | Status: AC | PRN
  Administered 2015-08-18: 10.8 via INTRAVENOUS

## 2015-08-18 MED ORDER — TECHNETIUM TC 99M SESTAMIBI GENERIC - CARDIOLITE
31.0000 | Freq: Once | INTRAVENOUS | Status: AC | PRN
Start: 2015-08-18 — End: 2015-08-18
  Administered 2015-08-18: 31 via INTRAVENOUS

## 2015-08-18 NOTE — Telephone Encounter (Signed)
Close encounter 

## 2015-08-19 ENCOUNTER — Telehealth: Payer: Self-pay | Admitting: *Deleted

## 2015-08-19 NOTE — Telephone Encounter (Signed)
High risk nuclear test reviewed with dr hilty(DOD), the pt will f/u with luke kilroy pa next week as scheduled.

## 2015-08-23 ENCOUNTER — Ambulatory Visit: Payer: BLUE CROSS/BLUE SHIELD | Admitting: Cardiology

## 2015-09-01 ENCOUNTER — Ambulatory Visit: Payer: BLUE CROSS/BLUE SHIELD | Admitting: Cardiology

## 2016-04-13 IMAGING — CR DG ABDOMEN 1V
1 series · 1 of 1 positions shown · non-contrast
Comparison: 07/13/2014 abdominal radiograph. 07/08/2014 CT abdomen
and pelvis.

CLINICAL DATA: Lithotripsy.

EXAM:
ABDOMEN - 1 VIEW

[t abdomen supine]
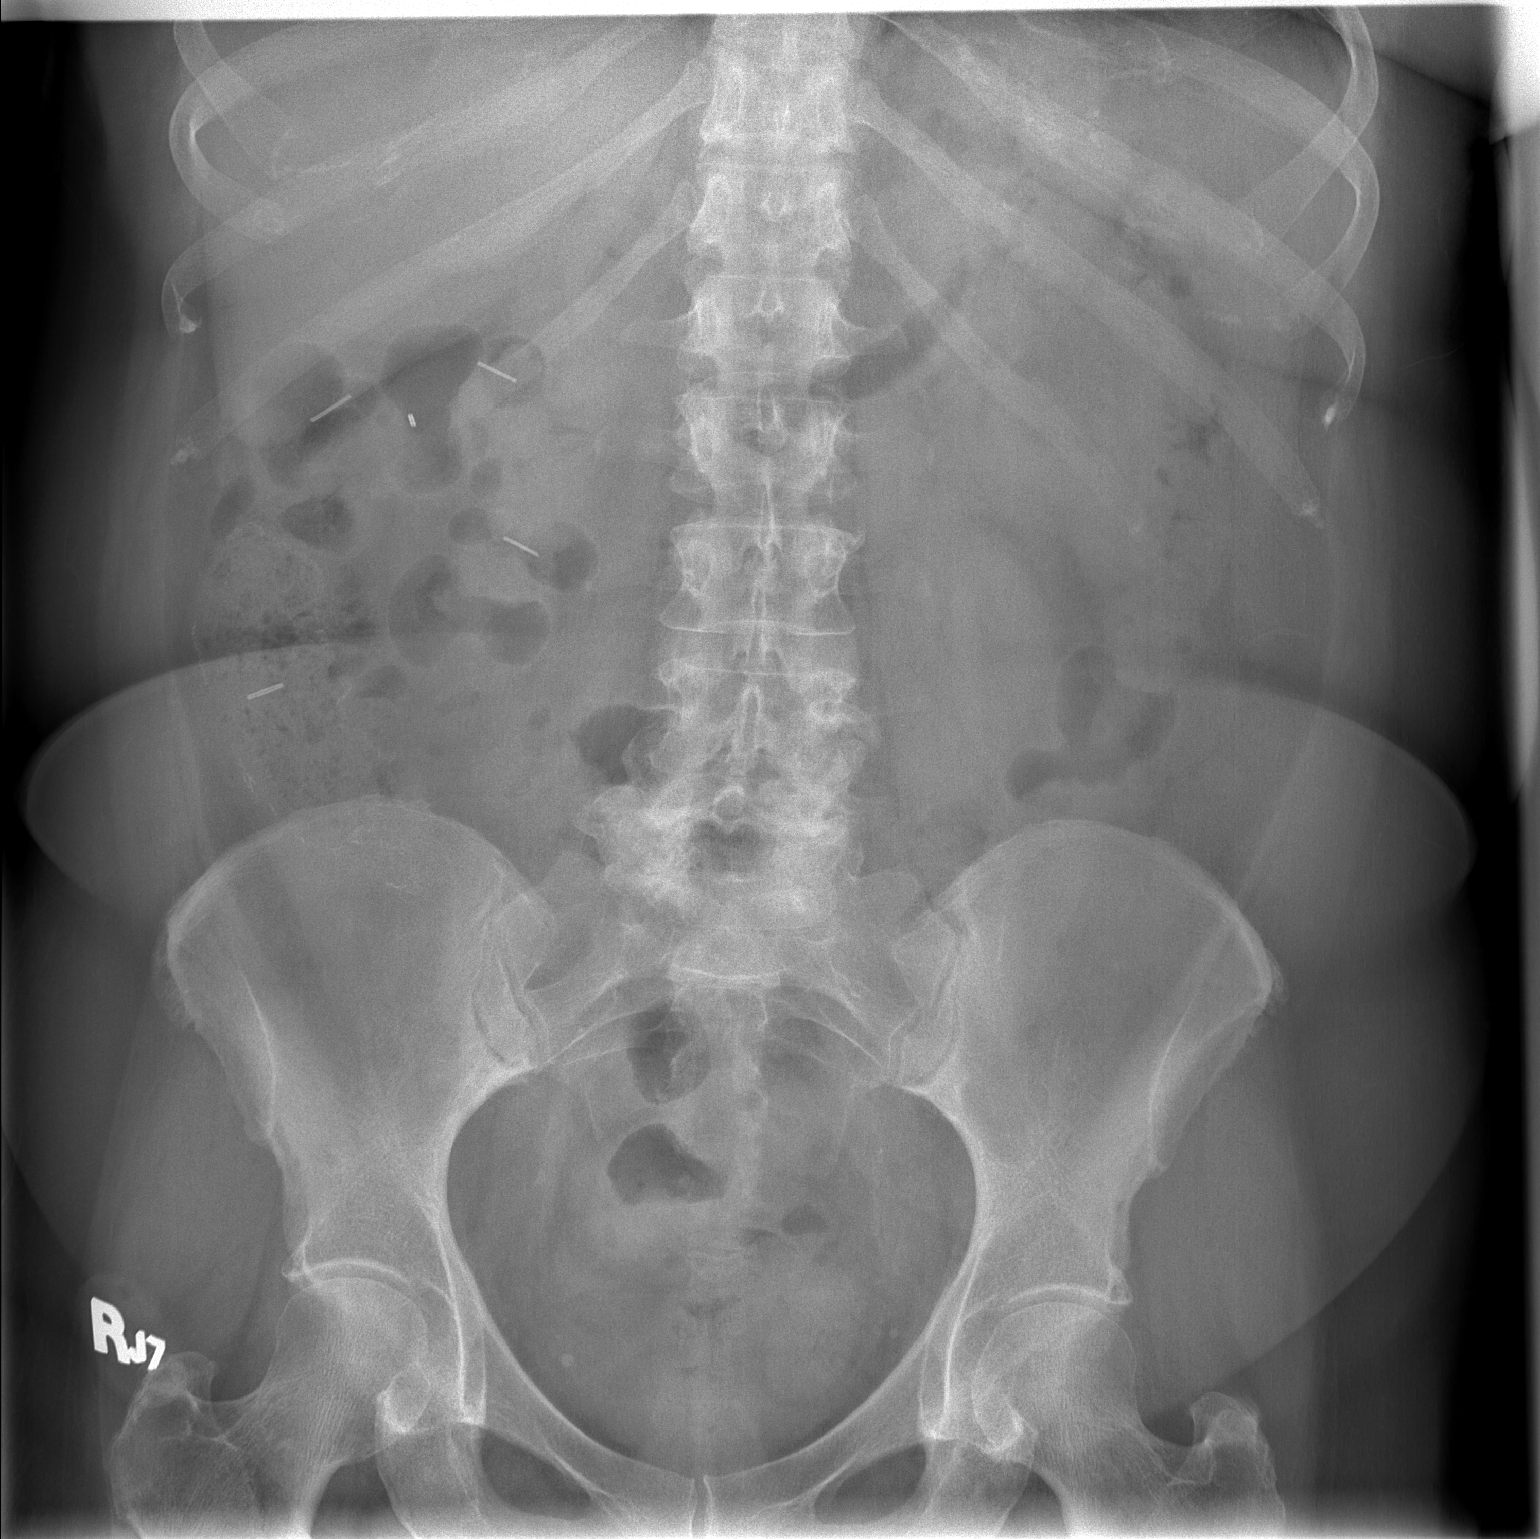

[1 of 1 positions shown; findings below may reference images not displayed]

FINDINGS: Multiple surgical clips and staple lines are visualized in the right
abdomen. No definite calcification is identified in the region of
either kidney. There is a subcentimeter focus of faintly increased
density in the region of the right kidney at the superior aspect of
a surgical clip, however this does not clearly match the location,
size, and shape of the right renal pelvis calculus seen on CT, which
is not definitely identified. Small calcifications in the pelvis
likely represent phleboliths. Lower lumbar spondylosis is noted.
IMPRESSION: No definite renal calculi identified.

## 2016-05-02 IMAGING — CR DG ABDOMEN 1V
1 series · 2 of 2 positions shown · non-contrast
Comparison: 07/26/2014.

CLINICAL DATA: Nephrolithiasis. Status post right-sided PERC on
08/13/2014.

EXAM:
ABDOMEN - 1 VIEW

[Series 1: ap (kub) · U · 2 of 2 slices shown]
[im 1/2]
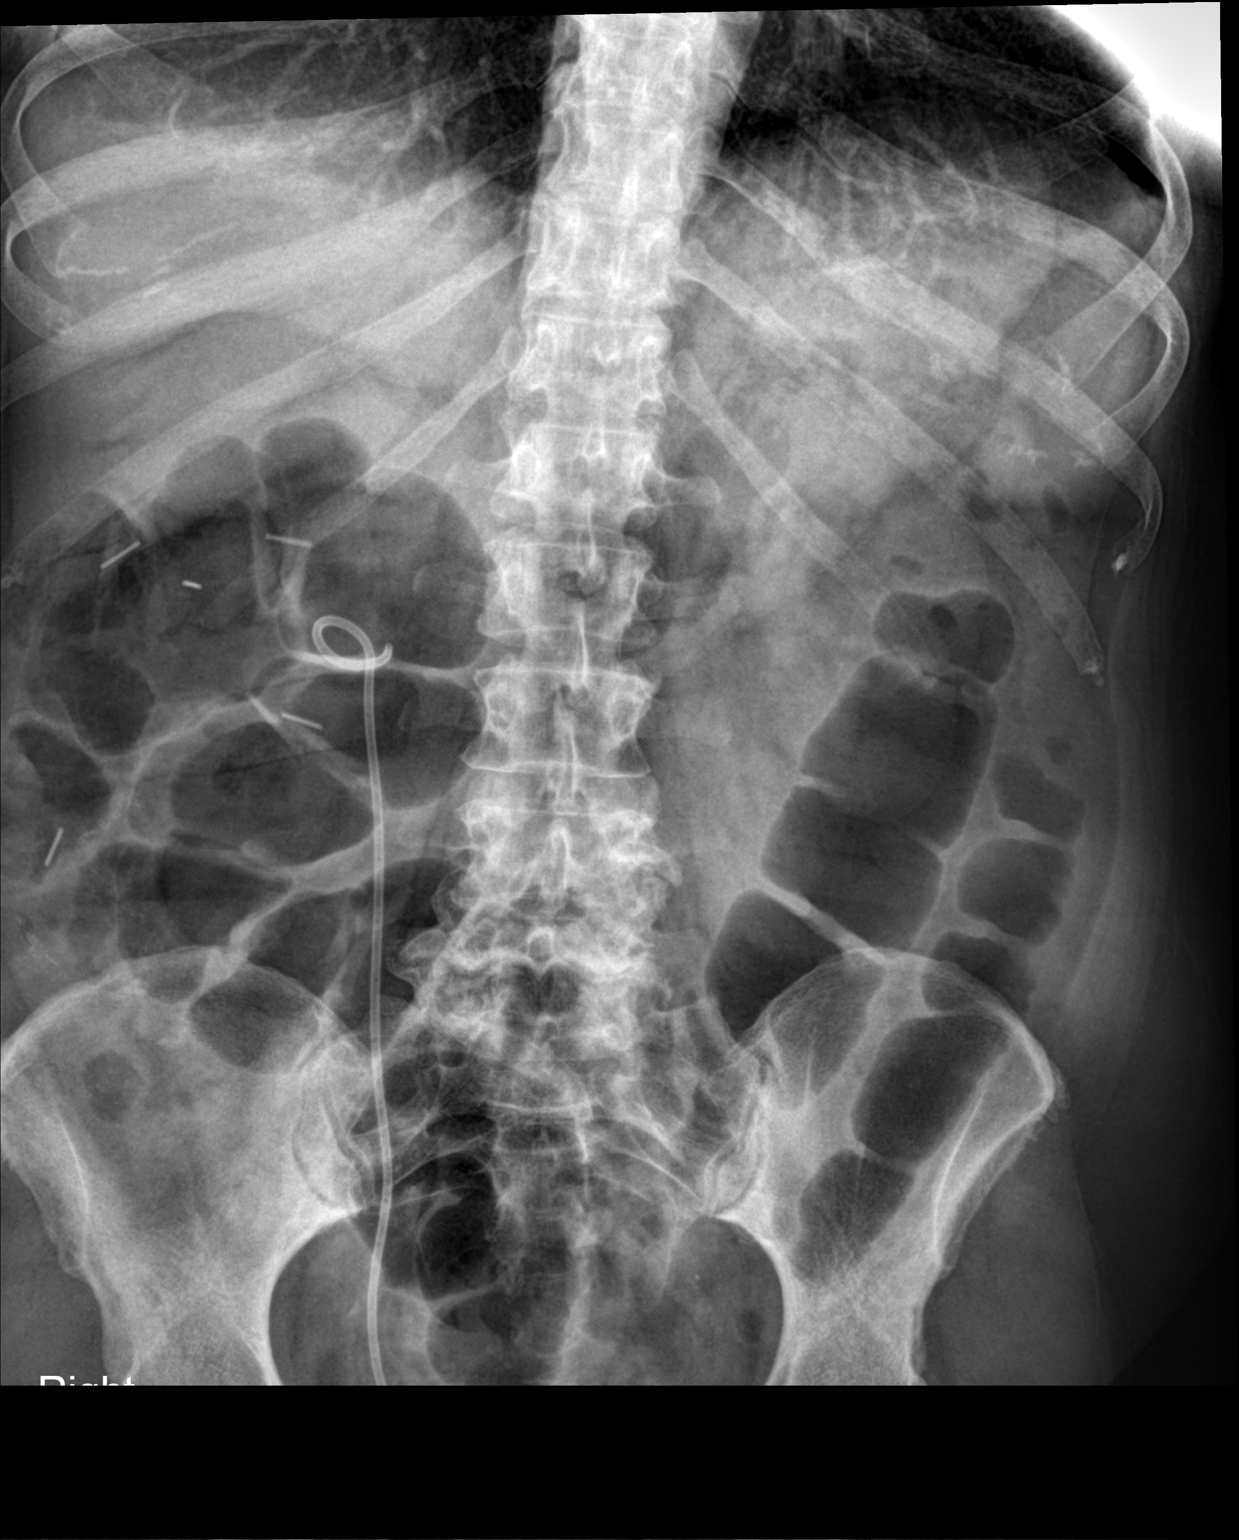
[im 2/2]
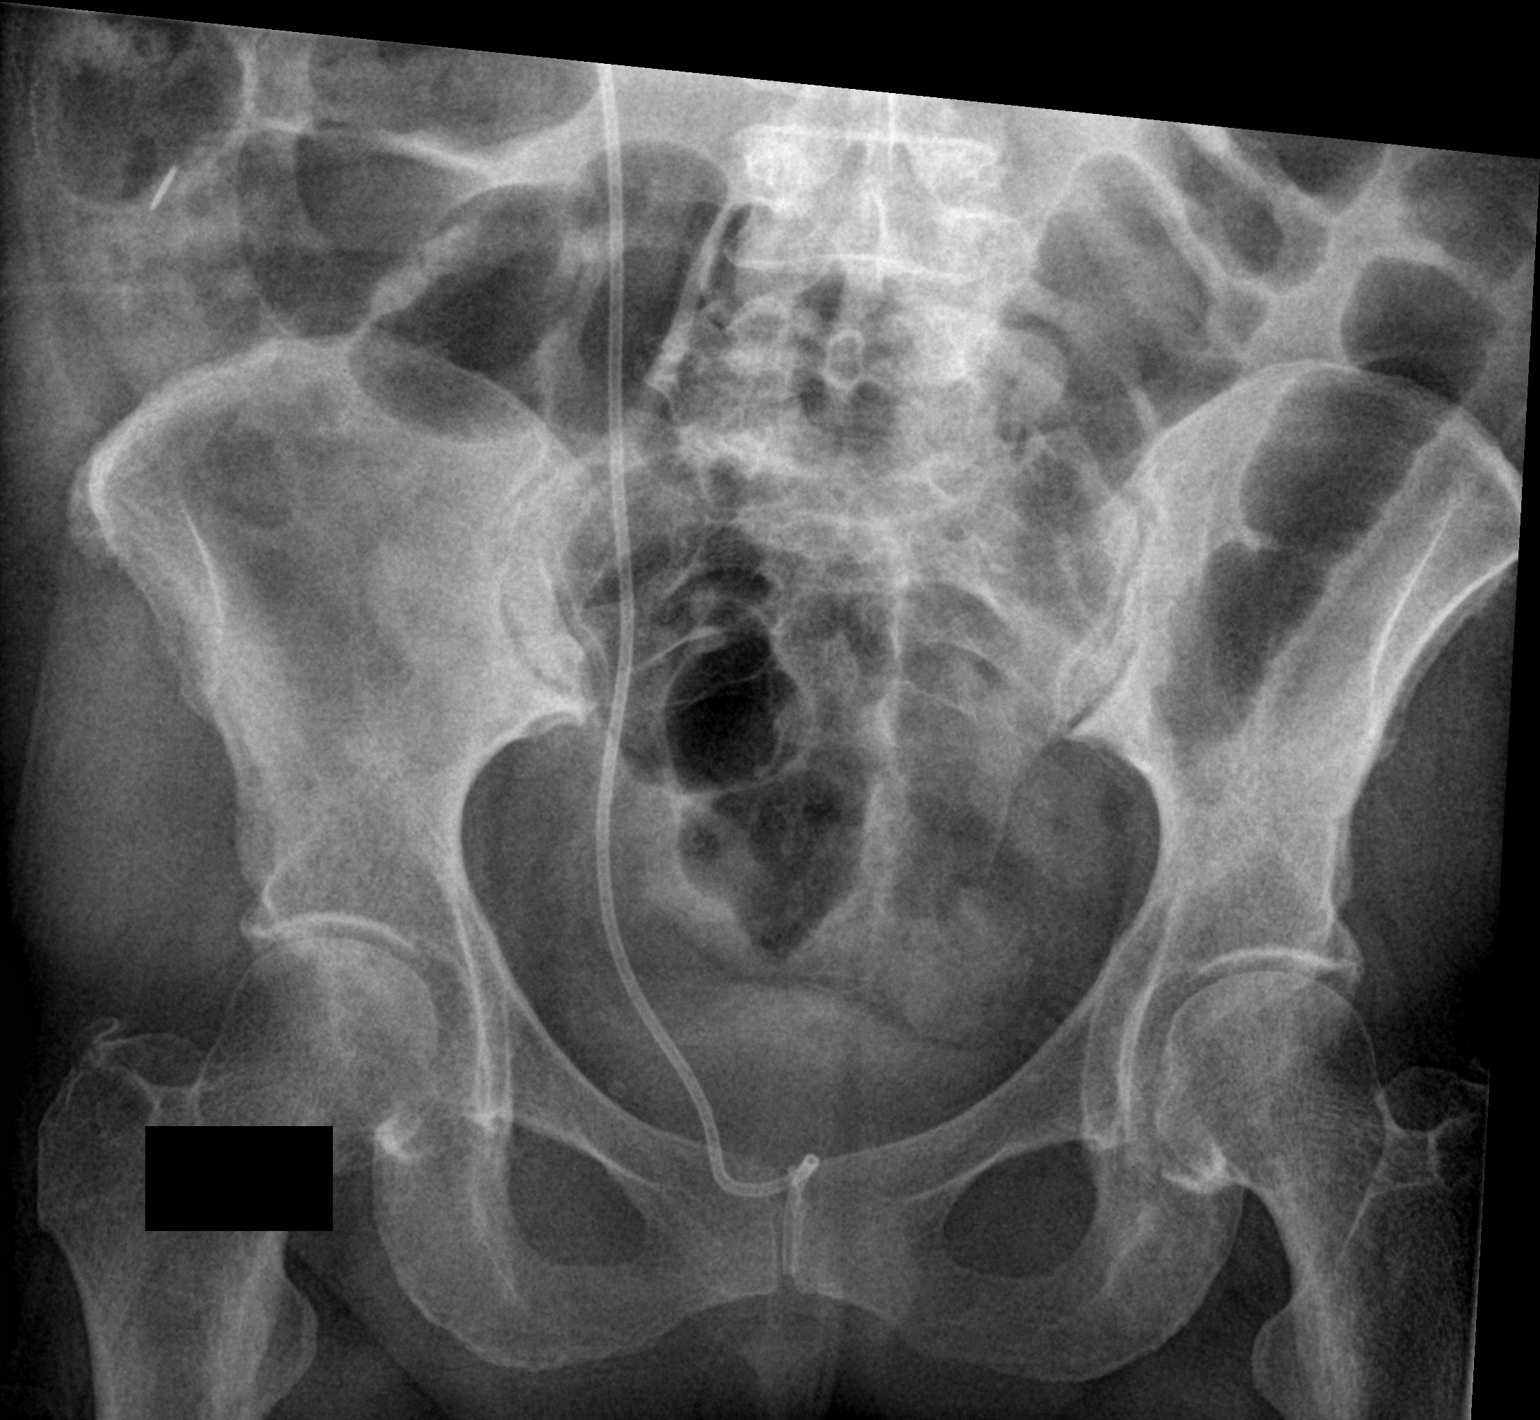

[2 of 2 positions shown; findings below may reference images not displayed]

FINDINGS: There is a right-sided double-J nephro ureteral stent in place.
Resolution of previous right renal calculus. Prominent air-filled
loops of large bowel are identified without evidence for small bowel
obstruction.
IMPRESSION: 1. Interval placement of right double-J nephro ureteral stent with
resolution of previous right renal calculus.
